# Patient Record
Sex: Female | Born: 1955 | Race: White | Hispanic: No | Marital: Married | Smoking: Never smoker
Health system: Southern US, Community
[De-identification: ages and names within clinical notes are randomized; demographics above are authoritative.]

## PROBLEM LIST (undated history)

## (undated) DIAGNOSIS — R569 Unspecified convulsions: Secondary | ICD-10-CM

## (undated) DIAGNOSIS — I38 Endocarditis, valve unspecified: Secondary | ICD-10-CM

## (undated) DIAGNOSIS — E079 Disorder of thyroid, unspecified: Secondary | ICD-10-CM

## (undated) DIAGNOSIS — M329 Systemic lupus erythematosus, unspecified: Secondary | ICD-10-CM

## (undated) DIAGNOSIS — E039 Hypothyroidism, unspecified: Secondary | ICD-10-CM

## (undated) DIAGNOSIS — I469 Cardiac arrest, cause unspecified: Secondary | ICD-10-CM

## (undated) DIAGNOSIS — I639 Cerebral infarction, unspecified: Secondary | ICD-10-CM

## (undated) HISTORY — PX: PATELLA FRACTURE SURGERY: SHX735

## (undated) HISTORY — PX: APPENDECTOMY: SHX54

## (undated) HISTORY — DX: Cerebral infarction, unspecified: I63.9

## (undated) HISTORY — PX: TUBAL LIGATION: SHX77

---

## 2002-06-11 DIAGNOSIS — I469 Cardiac arrest, cause unspecified: Secondary | ICD-10-CM

## 2002-06-11 HISTORY — DX: Cardiac arrest, cause unspecified: I46.9

## 2013-11-26 ENCOUNTER — Emergency Department (HOSPITAL_COMMUNITY)
Admission: EM | Admit: 2013-11-26 | Discharge: 2013-11-27 | Disposition: A | Discharge status: Home / Self Care | Payer: Medicaid Other | Attending: Emergency Medicine | Admitting: Emergency Medicine

## 2013-11-26 ENCOUNTER — Encounter (HOSPITAL_COMMUNITY): Payer: Self-pay | Admitting: Emergency Medicine

## 2013-11-26 ENCOUNTER — Emergency Department (HOSPITAL_COMMUNITY): Payer: Medicaid Other

## 2013-11-26 DIAGNOSIS — R569 Unspecified convulsions: Secondary | ICD-10-CM

## 2013-11-26 DIAGNOSIS — R072 Precordial pain: Secondary | ICD-10-CM | POA: Diagnosis present

## 2013-11-26 DIAGNOSIS — Z79899 Other long term (current) drug therapy: Secondary | ICD-10-CM | POA: Insufficient documentation

## 2013-11-26 DIAGNOSIS — E039 Hypothyroidism, unspecified: Secondary | ICD-10-CM | POA: Diagnosis not present

## 2013-11-26 DIAGNOSIS — Z8674 Personal history of sudden cardiac arrest: Secondary | ICD-10-CM | POA: Diagnosis not present

## 2013-11-26 DIAGNOSIS — IMO0002 Reserved for concepts with insufficient information to code with codable children: Secondary | ICD-10-CM | POA: Diagnosis not present

## 2013-11-26 DIAGNOSIS — G40909 Epilepsy, unspecified, not intractable, without status epilepticus: Secondary | ICD-10-CM | POA: Insufficient documentation

## 2013-11-26 HISTORY — DX: Systemic lupus erythematosus, unspecified: M32.9

## 2013-11-26 HISTORY — DX: Cardiac arrest, cause unspecified: I46.9

## 2013-11-26 HISTORY — DX: Unspecified convulsions: R56.9

## 2013-11-26 HISTORY — DX: Endocarditis, valve unspecified: I38

## 2013-11-26 HISTORY — DX: Disorder of thyroid, unspecified: E07.9

## 2013-11-26 LAB — BASIC METABOLIC PANEL
BUN: 13 mg/dL (ref 6–23)
CALCIUM: 9 mg/dL (ref 8.4–10.5)
CO2: 23 mEq/L (ref 19–32)
Chloride: 99 mEq/L (ref 96–112)
Creatinine, Ser: 1.13 mg/dL — ABNORMAL HIGH (ref 0.50–1.10)
GFR calc Af Amer: 61 mL/min — ABNORMAL LOW (ref >90)
GFR, EST NON AFRICAN AMERICAN: 53 mL/min — AB (ref >90)
Glucose, Bld: 108 mg/dL — ABNORMAL HIGH (ref 70–99)
Potassium: 4.1 mEq/L (ref 3.7–5.3)
Sodium: 135 mEq/L — ABNORMAL LOW (ref 137–147)

## 2013-11-26 LAB — CBC
HCT: 30 % — ABNORMAL LOW (ref 36.0–46.0)
Hemoglobin: 10.4 g/dL — ABNORMAL LOW (ref 12.0–15.0)
MCH: 32.4 pg (ref 26.0–34.0)
MCHC: 34.7 g/dL (ref 30.0–36.0)
MCV: 93.5 fL (ref 78.0–100.0)
PLATELETS: 121 10*3/uL — AB (ref 150–400)
RBC: 3.21 MIL/uL — AB (ref 3.87–5.11)
RDW: 11.8 % (ref 11.5–15.5)
WBC: 4.5 10*3/uL (ref 4.0–10.5)

## 2013-11-26 LAB — I-STAT TROPONIN, ED: TROPONIN I, POC: 0 ng/mL (ref 0.00–0.08)

## 2013-11-26 LAB — TROPONIN I: Troponin I: <0.3 ng/mL (ref <0.30)

## 2013-11-26 LAB — PHENYTOIN LEVEL, TOTAL: Phenytoin Lvl: <2.5 ug/mL — ABNORMAL LOW (ref 10.0–20.0)

## 2013-11-26 LAB — VALPROIC ACID LEVEL: Valproic Acid Lvl: <10 ug/mL — ABNORMAL LOW (ref 50.0–100.0)

## 2013-11-26 LAB — CBG MONITORING, ED: Glucose-Capillary: 107 mg/dL — ABNORMAL HIGH (ref 70–99)

## 2013-11-26 MED ORDER — SODIUM CHLORIDE 0.9 % IV SOLN
1000.0000 mg | Freq: Once | INTRAVENOUS | Status: AC
  Administered 2013-11-27: 1000 mg via INTRAVENOUS
  Filled 2013-11-26: qty 20

## 2013-11-26 NOTE — ED Notes (Signed)
Pt brought via [facility] for chest pain x 1 week and lethargy. Per family, they recently moved to area and she has been unable to locate depakote and thyroid meds.  C/o mid-sternal chest pain x 1 week.  Pt appears lethargic, but ao x 4.

## 2013-11-26 NOTE — ED Notes (Signed)
Called lab to add on Dilantin level

## 2013-11-26 NOTE — ED Provider Notes (Signed)
CSN: [id number]     Arrival date & time 11/26/13  1830 History   First MD Initiated Contact with Patient 11/26/13 2238     Chief Complaint  Patient presents with  . Chest Pain     (Consider location/radiation/quality/duration/timing/severity/associated sxs/prior Treatment) HPI  She initially came here, for chest pain, but that resolved spontaneously while here waiting to be seen. She also complains of petit mal seizures, and was out of her Dilantin, for 2 weeks. She restarted her Dilantin, 2 days ago. She denies fever, chills, nausea, vomiting, cough, abdominal pain, back, pain, weakness, or dizziness. [place]. There are no other known modifying factors.   Past Medical History  Diagnosis Date  . Endocarditis   . Lupus   . Cardiac arrest 2004  . Seizures   . Thyroid disease     hypothyroidism   Past Surgical History  Procedure Laterality Date  . Appendectomy    . Patella fracture surgery    . Cesarean section    . Tubal ligation     No family history on file. History  Substance Use Topics  . Smoking status: Never Smoker   . Smokeless tobacco: Never Used  . Alcohol Use: No   OB History   Grav Para Term Preterm Abortions TAB SAB Ect Mult Living                 Review of Systems  All other systems reviewed and are negative.     Allergies  Review of patient's allergies indicates no known allergies.  Home Medications   Prior to Admission medications   Medication Sig Start Date End Date Taking? Authorizing Provider  aspirin 81 MG tablet Take 324 mg by mouth once.   Yes Historical Provider, MD  Calcium Carb-Cholecalciferol (CALCIUM 600 + D PO) Take 1 tablet by mouth daily.   Yes Historical Provider, MD  levothyroxine (SYNTHROID, LEVOTHROID) 100 MCG tablet Take 50 mcg by mouth daily before breakfast.   Yes Historical Provider, MD  Multiple Vitamins-Minerals (MULTIVITAMIN WITH MINERALS) tablet Take 1 tablet by  mouth daily.   Yes Historical Provider, MD  phenytoin (DILANTIN) 100 MG ER capsule Take 100-200 mg by mouth 3 (three) times daily. Take 100 mg 3 times a day every wed,thur,friday & sat then 200 mg 3 times a day every mon, tues,sunday   Yes Historical Provider, MD   BP 127/90  Pulse 62  Temp(Src) 98.5 F (36.9 C) (Oral)  Resp 15  Ht 5\' 3"  (1.6 m)  Wt 164 lb (74.39 kg)  BMI 29.06 kg/m2  SpO2 96% Physical Exam  Nursing note and vitals reviewed. Constitutional: She is oriented to person, place, and time. She appears well-developed and well-nourished.  HENT:  Head: Normocephalic and atraumatic.  Eyes: Conjunctivae and EOM are normal. Pupils are equal, round, and reactive to light.  Neck: Normal range of motion and phonation normal. Neck supple.  Cardiovascular: Normal rate, regular rhythm and intact distal pulses.   Pulmonary/Chest: Effort normal and breath sounds normal. No respiratory distress. She has no wheezes. She exhibits no tenderness.  Abdominal: Soft. She exhibits no distension. There is no tenderness. There is no guarding.  Musculoskeletal: Normal range of motion.  Neurological: She is alert and oriented to person, place, and time. She exhibits normal muscle tone.  No dysarthria, aphasia or nystagmus.  Skin: Skin is warm and dry.  Bruising anterior shins bilaterally, mild  Psychiatric: She has a normal mood and affect.  Her behavior is normal. Judgment and thought content normal.    ED Course  Procedures (including critical care time)  22:55- Dilantin level ordered  00:00- Dilantin level subtherapeutic; load ordered.  Labs Review Labs Reviewed  CBC - Abnormal; Notable for the following:    RBC 3.21 (*)    Hemoglobin 10.4 (*)    HCT 30.0 (*)    Platelets 121 (*)    All other components within normal limits  BASIC METABOLIC PANEL - Abnormal; Notable for the following:    Sodium 135 (*)    Glucose, Bld 108 (*)    Creatinine, Ser 1.13 (*)    GFR calc non Af Amer 53  (*)    GFR calc Af Amer 61 (*)    All other components within normal limits  VALPROIC ACID LEVEL - Abnormal; Notable for the following:    Valproic Acid Lvl <10.0 (*)    All other components within normal limits  PHENYTOIN LEVEL, TOTAL - Abnormal; Notable for the following:    Phenytoin Lvl <2.5 (*)    All other components within normal limits  CBG MONITORING, ED - Abnormal; Notable for the following:    Glucose-Capillary 107 (*)    All other components within normal limits  TROPONIN I  Rosezena SensorI-STAT TROPOININ, ED    Imaging Review Dg Chest Port 1 View  11/26/2013   CLINICAL DATA:  Chest pain.  EXAM: PORTABLE CHEST - 1 VIEW  COMPARISON:  None.  FINDINGS: The heart size and mediastinal contours are within normal limits. Both lungs are clear. The visualized skeletal structures are unremarkable.  IMPRESSION: No active disease.   Electronically Signed   By: Rosalie GumsBeth  Brown M.D.   On: 11/26/2013 19:44     EKG Interpretation   Date/Time:  Thursday November 26 2013 18:37:23 EDT Ventricular Rate:  77 PR Interval:  166 QRS Duration: 88 QT Interval:  432 QTC Calculation: 489 R Axis:   -2 Text Interpretation:  Sinus rhythm RSR' in V1 or V2, right VCD or RVH  Borderline T abnormalities, anterior leads Borderline prolonged QT  interval No old tracing to compare Confirmed by [email or web address]  MD, Emelia Sandoval [phone]([id number])  on 11/26/2013 6:49:58 PM      MDM   Final diagnoses:  Epilepsy without status epilepticus, not intractable  Seizure secondary to subtherapeutic anticonvulsant medication    Seizures related to medical noncompliance. No other adverse effects.   Nursing Notes Reviewed/ Care Coordinated Applicable Imaging Reviewed Interpretation of Laboratory Data incorporated into ED treatment  The patient appears reasonably screened and/or stabilized for discharge and I doubt any other medical condition or other [facility] requiring further screening, evaluation, or treatment in the ED at this time prior to  discharge.  Plan: Home Medications- usual; Home Treatments- rest; return here if the recommended treatment, does not improve the symptoms; Recommended follow up- PCP of choice 1 week for check up  Flint MelterElliott L Kasheena Sambrano, MD 11/27/13 [phone]

## 2013-11-27 DIAGNOSIS — G40909 Epilepsy, unspecified, not intractable, without status epilepticus: Secondary | ICD-10-CM | POA: Diagnosis not present

## 2013-11-27 NOTE — Discharge Instructions (Signed)
Take your Dilantin, as prescribed. Use the resource guide, to find a doctor to see you for ongoing care. See a doctor to get her Dilantin level checked in one or 2 weeks.   Epilepsy Epilepsy is a disorder in which a person has repeated seizures over time. A seizure is a release of abnormal electrical activity in the brain. Seizures can cause a change in attention, behavior, or the ability to remain awake and alert (altered mental status). Seizures often involve uncontrollable shaking (convulsions).  Most people with epilepsy lead normal lives. However, people with epilepsy are at an increased risk of falls, accidents, and injuries. Therefore, it is important to begin treatment right away. CAUSES  Epilepsy has many possible causes. Anything that disturbs the normal pattern of brain cell activity can lead to seizures. This may include:   Head injury.  Birth trauma.  High fever as a child.  Stroke.  Bleeding into or around the brain.  Certain drugs.  Prolonged low oxygen, such as what occurs after CPR efforts.  Abnormal brain development.  Certain illnesses, such as meningitis, encephalitis (brain infection), malaria, and other infections.  An imbalance of nerve signaling chemicals (neurotransmitters).  SIGNS AND SYMPTOMS  The symptoms of a seizure can vary greatly from one person to another. Right before a seizure, you may have a warning (aura) that a seizure is about to occur. An aura may include the following symptoms:  Fear or anxiety.  Nausea.  Feeling like the room is spinning (vertigo).  Vision changes, such as seeing flashing lights or spots. Common symptoms during a seizure include:  Abnormal sensations, such as an abnormal smell or a bitter taste in the mouth.   Sudden, general body stiffness.   Convulsions that involve rhythmic jerking of the face, arm, or leg on one or both sides.   Sudden change in consciousness.   Appearing to be awake but not  responding.   Appearing to be asleep but cannot be awakened.   Grimacing, chewing, lip smacking, drooling, tongue biting, or loss of bowel or bladder control. After a seizure, you may feel sleepy for a while. DIAGNOSIS  Your health care provider will ask about your symptoms and take a medical history. Descriptions from any witnesses to your seizures will be very helpful in the diagnosis. A physical exam, including a detailed neurological exam, is necessary. Various tests may be done, such as:   An electroencephalogram (EEG). This is a painless test of your brain waves. In this test, a diagram is created of your brain waves. These diagrams can be interpreted by a specialist.  An MRI of the brain.   A CT scan of the brain.   A spinal tap (lumbar puncture, LP).  Blood tests to check for signs of infection or abnormal blood chemistry. TREATMENT  There is no cure for epilepsy, but it is generally treatable. Once epilepsy is diagnosed, it is important to begin treatment as soon as possible. For most people with epilepsy, seizures can be controlled with medicines. The following may also be used:  A pacemaker for the brain (vagus nerve stimulator) can be used for people with seizures that are not well controlled by medicine.  Surgery on the brain. For some people, epilepsy eventually goes away. HOME CARE INSTRUCTIONS   Follow your health care provider's recommendations on driving and safety in normal activities.  Get enough rest. Lack of sleep can cause seizures.  Only take over-the-counter or prescription medicines as directed by your health care  provider. Take any prescribed medicine exactly as directed.  Avoid any known triggers of your seizures.  Keep a seizure diary. Record what you recall about any seizure, especially any possible trigger.   Make sure the people you live and work with know that you are prone to seizures. They should receive instructions on how to help you. In  general, a witness to a seizure should:   Cushion your head and body.   Turn you on your side.   Avoid unnecessarily restraining you.   Not place anything inside your mouth.   Call for emergency medical help if there is any question about what has occurred.   Follow up with your health care provider as directed. You may need regular blood tests to monitor the levels of your medicine.  SEEK MEDICAL CARE IF:   You develop signs of infection or other illness. This might increase the risk of a seizure.   You seem to be having more frequent seizures.   Your seizure pattern is changing.  SEEK IMMEDIATE MEDICAL CARE IF:   You have a seizure that does not stop after a few moments.   You have a seizure that causes any difficulty in breathing.   You have a seizure that results in a very severe headache.   You have a seizure that leaves you with the inability to speak or use a part of your body.  Document Released: 05/28/2005 Document Revised: 03/18/2013 Document Reviewed: 01/07/2013 [facility] Patient Information 2015 [place], [place]. This information is not intended to replace advice given to you by your health care provider. Make sure you discuss any questions you have with your health care provider.    Emergency Department Resource Guide 1) Find a Doctor and Pay Out of Pocket Although you won't have to find out who is covered by your insurance plan, it is a good idea to ask around and get recommendations. You will then need to call the office and see if the doctor you have chosen will accept you as a new patient and what types of options they offer for patients who are self-pay. Some doctors offer discounts or will set up payment plans for their patients who do not have insurance, but you will need to ask so you aren't surprised when you get to your appointment.  2) Contact Your Local Health Department Not all health departments have doctors that can see patients for sick  visits, but many do, so it is worth a call to see if yours does. If you don't know where your local health department is, you can check in your phone book. The CDC also has a tool to help you locate your state's health department, and many state websites also have listings of all of their local health departments.  3) Find a Walk-in Clinic If your illness is not likely to be very severe or complicated, you may want to try a walk in clinic. These are popping up all over the country in pharmacies, drugstores, and shopping centers. They're usually staffed by nurse practitioners or physician assistants that have been trained to treat common illnesses and complaints. They're usually fairly quick and inexpensive. However, if you have serious medical issues or chronic medical problems, these are probably not your best option.  No Primary Care Doctor: - Call Health Connect at  [phone] - they can help you locate a primary care doctor that  accepts your insurance, provides certain services, etc. - Physician Referral Service- [phone]  Chronic Pain  Problems: Organization         Address  Phone   Notes  [facility]  [phone] Patients need to be referred by their primary care doctor.   Medication Assistance: Organization         Address  Phone   Notes  [facility] Medication [facility] [address] [place], [place] [address] [phone] --Must be a resident of [facility] -- Must have NO insurance coverage whatsoever (no Medicaid/ Medicare, etc.) -- The pt. MUST have a primary care doctor that directs their care regularly and follows them in the community   [facility]  [phone]   [employer]  [phone]    Agencies that provide inexpensive medical care: Organization         Address  Phone   Notes  [facility]  [phone]   [facility]    [phone]   [facility] [address] [place], [place] [address] [phone]   [facility] [address] [place] [place] [phone]   Planned Parenthood    [phone]   [facility]    [phone]   [facility]  [address], [place] Phone:  [phone], Fax:  [phone] Hours of Operation:  9 am - 6 pm, M-F.  Also accepts Medicaid/Medicare and self-pay.  [facility]  [address], [place] Phone: [phone], Fax: [phone]. Hours of Operation:  8:30 am - 5:30 pm, M-F.  Also accepts Medicaid and self-pay.  [facility] [place] [address] [place] Phone: [phone]   [facility] [address] Natasha Bence [place], [place] [phone], Ext. [phone] Mondays & Thursdays: 7-9 AM.  First 15 patients are seen on a first come, first serve basis.    Medicaid-accepting [facility] Providers:  Organization         Address  Phone   Notes  [facility] [address], [place] [phone] Also accepts self-pay patients.  [facility] [address] Laurell Josephs [place], [place]  [phone]   [facility] [address], [place] [phone]   [facility] [address], [place] [phone]   Renaye Rakers [address], [place]   [phone] Only accepts [place] Access [place] patients after they have their name applied to their card.   Self-Pay (no insurance) in [facility]:  Organization         Address  Phone   Notes  Sickle Cell Patients, [facility] [address] [place], [place] [phone]   [facility] [address] [place], [place] [phone]   [facility] [place]  [address], [place] [phone]   [facility]/Dr. Osei-Bonsu  [address]  [place] or [address], [place] [phone] Phone number for both [place] and [place] locations is the same.  [facility] [address], [place] [phone]   [facility] [address], [place] or [address] [phone]   [facility] [address], [place] [phone], phone; [phone], fax Sees patients 1st  and 3rd Saturday of every month.  Must not qualify for public or private insurance (i.e. Medicaid, Medicare, [place] Health Choice, Veterans' Benefits)  Household income should be no more than 200% of the poverty level The clinic cannot treat you if you are pregnant or think you are pregnant  Sexually transmitted diseases are not treated at the clinic.    Dental Care: Organization         Address  Phone  Notes  [facility] [address], [place] [phone] Accepts children up to age 58 who are enrolled in [place] or [place] Health Choice; pregnant women with a Medicaid card; and children who have applied for Medicaid or [place] Health Choice, but were declined, whose parents can pay a reduced fee at time of service.  [facility] [place]  [address], [place] [phone] Accepts children up to age 58 who are enrolled in [place] or [place] Health Choice; pregnant women with a Medicaid card; and children who have applied for Medicaid or  Health Choice, but were declined, whose parents can pay a reduced fee at time of service.  [facility]  [address], [place] [phone] Patients are seen by appointment only. Walk-ins are not accepted. [facility] will see patients 58 years of age and older. Monday - Tuesday (8am-5pm) Most Wednesdays (8:30-5pm) $30 per visit, cash only  [facility]  [address], [facility] [phone] Patients are seen by appointment only. Walk-ins are not accepted. [facility] will see patients 58 years of age and older. One Wednesday Evening (Monthly: Volunteer Based).  $30 per visit, cash only  [facility]  [phone] for adults; Children under age 684, call Graduate Pediatric Dentistry at [phone]. Children aged 58-14, please call [phone] to request a pediatric application.  Dental services are provided in all areas of dental care including fillings, crowns and bridges, complete and partial dentures, implants, gum treatment, root canals, and extractions. Preventive care is also provided. Treatment is provided to both adults and children. Patients are selected via a lottery and there is often a waiting list.   [facility] [address], [place]  [phone] [email or web address]   [facility] [address], [place], [place] [phone], Ext. [phone] Second and Fourth Thursday of each month, opens at 6:30 AM; Clinic ends at 9 AM.  Patients are seen on a first-come first-served basis, and a limited number are seen during each clinic.   [facility]  [address], [place], [place] [phone]   Eligibility Requirements You must have lived in [place], [place], or [place] counties for at least the last three months.   You cannot be eligible for state or federal sponsored [place] Cityhealthcare insurance, including CIGNAVeterans Administration, [place]icaid, or [employer] EntertainmentMedicare.   You generally cannot be eligible for healthcare insurance through your employer.    How to apply: Eligibility screenings are held every Tuesday and Wednesday afternoon from 1:00 pm until 4:00 pm. You do not need an appointment for the interview!  [facility] [address], [place], [place] [phone]   [facility]  [phone]   [facility]  [phone]   [facility]  [phone]    Behavioral Health Resources in the Community: Intensive Outpatient Programs Organization  Address  Phone  Notes  [facility] [address] [place], [place] [phone]   [facility] Outpatient [address], [place], [place] [phone]   [facility] [address], [place], [place]  [phone]   [facility] Mental Health [address],  [place], [place] [phone] or [phone]   Substance Abuse Resources Organization         Address  Phone  Notes  Alcohol [facility] Drug Services  [phone]   [facility]  [phone]   [facility] [place]  [phone]   Floydene Flock  [phone]   Residential & Outpatient Substance Abuse Program  [phone]   Psychological Services Organization         Address  Phone  Notes  [facility] Health  [phone]   [facility]  [phone]   [facility] [address] [place] [phone] or [phone]    Mobile Crisis Teams Organization         Address  Phone  Notes  [facility], Mobile Crisis Care Unit  [phone]   [facility]  [address] [place], [place] [phone]   Doristine Locks [address] [place] [phone]    Self-Help/Support Groups Organization         Address  Phone             Notes  [facility] [place] - variety of support groups  336- I7437963 Call for more information  Narcotics Anonymous (NA), [facility] [address], [employer] [place]  2 meetings at this location   Statistician         Address  Phone  Notes  [facility] [address],    [place]  [phone]   [facility]  [address], [place] [address], [place], [place] [phone]   [facility] [address] [place], [place]  [phone] Admissions: 8am-3pm M-F  [facility] [address],    [place], [place] [phone]   [facility] [address] [place], [place], [place] [phone]   [facility] [address],  [place], [place] [phone]   [facility] - Intensive Outpatient [address]ure[address] Josephs [address], [place], [place] [phone]   [facility] ([facility] [address] [place].,  [place], [place] [phone] or [phone]   [facility] ([facility]) [address] [place], [place] [phone] Accepts Medicaid  [facility] [address],  [place] [phone] Substance Abuse/Addiction Treatment   [facility] Organization         Address  Phone  Notes  [facility]  [phone]   Angie Fava, PhD [address] Ervin Knack [place], [place]   [phone] or [phone]   [facility] Behavioral   [address] [place], [place] [phone]   [facility] [address] [place], [place] [phone] Insurance/Medicaid/sponsorship through [facility] and Families [address]                                    [place], [place] [phone] Therapy/tele-psych/case  [facility] [address], [place] [phone]    Dr. Lolly Mustache  [phone]   [facility] [place]  [facility] Dept. 1) [address], [place] 2) [address], [place] 3)  [address], [place] [phone]  [phone]   [facility] Child Abuse Hotline (331)007-6288 or [phone] (After Hours)

## 2016-11-24 ENCOUNTER — Encounter (HOSPITAL_COMMUNITY): Payer: Self-pay | Admitting: Emergency Medicine

## 2016-11-24 ENCOUNTER — Emergency Department (HOSPITAL_COMMUNITY): Payer: Medicaid Other

## 2016-11-24 DIAGNOSIS — Z8674 Personal history of sudden cardiac arrest: Secondary | ICD-10-CM

## 2016-11-24 DIAGNOSIS — R569 Unspecified convulsions: Secondary | ICD-10-CM | POA: Diagnosis present

## 2016-11-24 DIAGNOSIS — Z7982 Long term (current) use of aspirin: Secondary | ICD-10-CM

## 2016-11-24 DIAGNOSIS — L930 Discoid lupus erythematosus: Secondary | ICD-10-CM | POA: Diagnosis present

## 2016-11-24 DIAGNOSIS — K5651 Intestinal adhesions [bands], with partial obstruction: Principal | ICD-10-CM | POA: Diagnosis present

## 2016-11-24 DIAGNOSIS — Z79899 Other long term (current) drug therapy: Secondary | ICD-10-CM

## 2016-11-24 DIAGNOSIS — E039 Hypothyroidism, unspecified: Secondary | ICD-10-CM | POA: Diagnosis present

## 2016-11-24 DIAGNOSIS — N183 Chronic kidney disease, stage 3 (moderate): Secondary | ICD-10-CM | POA: Diagnosis present

## 2016-11-24 DIAGNOSIS — K5790 Diverticulosis of intestine, part unspecified, without perforation or abscess without bleeding: Secondary | ICD-10-CM | POA: Diagnosis present

## 2016-11-24 DIAGNOSIS — D72829 Elevated white blood cell count, unspecified: Secondary | ICD-10-CM | POA: Diagnosis present

## 2016-11-24 LAB — CBC
HCT: 35.1 % — ABNORMAL LOW (ref 36.0–46.0)
Hemoglobin: 11.7 g/dL — ABNORMAL LOW (ref 12.0–15.0)
MCH: 32.4 pg (ref 26.0–34.0)
MCHC: 33.3 g/dL (ref 30.0–36.0)
MCV: 97.2 fL (ref 78.0–100.0)
PLATELETS: 182 10*3/uL (ref 150–400)
RBC: 3.61 MIL/uL — AB (ref 3.87–5.11)
RDW: 12.2 % (ref 11.5–15.5)
WBC: 11.8 10*3/uL — AB (ref 4.0–10.5)

## 2016-11-24 LAB — I-STAT TROPONIN, ED: TROPONIN I, POC: 0.01 ng/mL (ref 0.00–0.08)

## 2016-11-24 LAB — BASIC METABOLIC PANEL
Anion gap: 12 (ref 5–15)
BUN: 11 mg/dL (ref 6–20)
CALCIUM: 9.6 mg/dL (ref 8.9–10.3)
CO2: 25 mmol/L (ref 22–32)
CREATININE: 1.23 mg/dL — AB (ref 0.44–1.00)
Chloride: 97 mmol/L — ABNORMAL LOW (ref 101–111)
GFR calc non Af Amer: 47 mL/min — ABNORMAL LOW (ref >60)
GFR, EST AFRICAN AMERICAN: 54 mL/min — AB (ref >60)
Glucose, Bld: 160 mg/dL — ABNORMAL HIGH (ref 65–99)
Potassium: 3.7 mmol/L (ref 3.5–5.1)
Sodium: 134 mmol/L — ABNORMAL LOW (ref 135–145)

## 2016-11-24 NOTE — ED Triage Notes (Signed)
Pt presents to ED for assessment of epigastric pain starting approx 2 hours ago, followed by 5 episodes of vomiting, constant nausea, and general malaise.  Pt denies SOB.

## 2016-11-25 ENCOUNTER — Inpatient Hospital Stay (HOSPITAL_COMMUNITY)
Admission: EM | Admit: 2016-11-25 | Discharge: 2016-11-28 | DRG: 390 | Disposition: A | Discharge status: Home / Self Care | Payer: Medicaid Other | Attending: Internal Medicine | Admitting: Internal Medicine

## 2016-11-25 ENCOUNTER — Inpatient Hospital Stay (HOSPITAL_COMMUNITY): Payer: Medicaid Other

## 2016-11-25 ENCOUNTER — Encounter (HOSPITAL_COMMUNITY): Payer: Self-pay

## 2016-11-25 ENCOUNTER — Emergency Department (HOSPITAL_COMMUNITY): Payer: Medicaid Other

## 2016-11-25 DIAGNOSIS — N183 Chronic kidney disease, stage 3 unspecified: Secondary | ICD-10-CM | POA: Diagnosis present

## 2016-11-25 DIAGNOSIS — Z0189 Encounter for other specified special examinations: Secondary | ICD-10-CM | POA: Diagnosis not present

## 2016-11-25 DIAGNOSIS — Z79899 Other long term (current) drug therapy: Secondary | ICD-10-CM | POA: Diagnosis not present

## 2016-11-25 DIAGNOSIS — E032 Hypothyroidism due to medicaments and other exogenous substances: Secondary | ICD-10-CM | POA: Diagnosis not present

## 2016-11-25 DIAGNOSIS — K565 Intestinal adhesions [bands], unspecified as to partial versus complete obstruction: Secondary | ICD-10-CM | POA: Diagnosis present

## 2016-11-25 DIAGNOSIS — D72829 Elevated white blood cell count, unspecified: Secondary | ICD-10-CM | POA: Diagnosis present

## 2016-11-25 DIAGNOSIS — R569 Unspecified convulsions: Secondary | ICD-10-CM | POA: Diagnosis not present

## 2016-11-25 DIAGNOSIS — L930 Discoid lupus erythematosus: Secondary | ICD-10-CM | POA: Diagnosis present

## 2016-11-25 DIAGNOSIS — Z8674 Personal history of sudden cardiac arrest: Secondary | ICD-10-CM | POA: Diagnosis not present

## 2016-11-25 DIAGNOSIS — E039 Hypothyroidism, unspecified: Secondary | ICD-10-CM | POA: Diagnosis present

## 2016-11-25 DIAGNOSIS — K5651 Intestinal adhesions [bands], with partial obstruction: Secondary | ICD-10-CM | POA: Diagnosis present

## 2016-11-25 DIAGNOSIS — K56609 Unspecified intestinal obstruction, unspecified as to partial versus complete obstruction: Secondary | ICD-10-CM | POA: Diagnosis present

## 2016-11-25 DIAGNOSIS — K5790 Diverticulosis of intestine, part unspecified, without perforation or abscess without bleeding: Secondary | ICD-10-CM | POA: Diagnosis present

## 2016-11-25 DIAGNOSIS — Z7982 Long term (current) use of aspirin: Secondary | ICD-10-CM | POA: Diagnosis not present

## 2016-11-25 HISTORY — DX: Hypothyroidism, unspecified: E03.9

## 2016-11-25 LAB — CBC
HCT: 38.1 % (ref 36.0–46.0)
HEMOGLOBIN: 12.9 g/dL (ref 12.0–15.0)
MCH: 33.5 pg (ref 26.0–34.0)
MCHC: 33.9 g/dL (ref 30.0–36.0)
MCV: 99 fL (ref 78.0–100.0)
Platelets: 155 10*3/uL (ref 150–400)
RBC: 3.85 MIL/uL — AB (ref 3.87–5.11)
RDW: 12.5 % (ref 11.5–15.5)
WBC: 9.9 10*3/uL (ref 4.0–10.5)

## 2016-11-25 LAB — TROPONIN I

## 2016-11-25 LAB — BASIC METABOLIC PANEL
ANION GAP: 10 (ref 5–15)
BUN: 13 mg/dL (ref 6–20)
CALCIUM: 9.5 mg/dL (ref 8.9–10.3)
CO2: 29 mmol/L (ref 22–32)
Chloride: 99 mmol/L — ABNORMAL LOW (ref 101–111)
Creatinine, Ser: 1.23 mg/dL — ABNORMAL HIGH (ref 0.44–1.00)
GFR, EST AFRICAN AMERICAN: 54 mL/min — AB (ref >60)
GFR, EST NON AFRICAN AMERICAN: 47 mL/min — AB (ref >60)
GLUCOSE: 128 mg/dL — AB (ref 65–99)
Potassium: 4.5 mmol/L (ref 3.5–5.1)
SODIUM: 138 mmol/L (ref 135–145)

## 2016-11-25 LAB — TYPE AND SCREEN
ABO/RH(D): A POS
Antibody Screen: NEGATIVE

## 2016-11-25 LAB — HEPATIC FUNCTION PANEL
ALT: 17 U/L (ref 14–54)
AST: 25 U/L (ref 15–41)
Albumin: 4.3 g/dL (ref 3.5–5.0)
Alkaline Phosphatase: 106 U/L (ref 38–126)
BILIRUBIN DIRECT: 0.2 mg/dL (ref 0.1–0.5)
BILIRUBIN INDIRECT: 0.4 mg/dL (ref 0.3–0.9)
TOTAL PROTEIN: 8.3 g/dL — AB (ref 6.5–8.1)
Total Bilirubin: 0.6 mg/dL (ref 0.3–1.2)

## 2016-11-25 LAB — HIV ANTIBODY (ROUTINE TESTING W REFLEX): HIV SCREEN 4TH GENERATION: NONREACTIVE

## 2016-11-25 LAB — ABO/RH: ABO/RH(D): A POS

## 2016-11-25 LAB — APTT: aPTT: 47 seconds — ABNORMAL HIGH (ref 24–36)

## 2016-11-25 LAB — PROTIME-INR
INR: 1.09
Prothrombin Time: 14.2 seconds (ref 11.4–15.2)

## 2016-11-25 LAB — PHENYTOIN LEVEL, TOTAL: Phenytoin Lvl: 5.6 ug/mL — ABNORMAL LOW (ref 10.0–20.0)

## 2016-11-25 LAB — LACTIC ACID, PLASMA: LACTIC ACID, VENOUS: 2.6 mmol/L — AB (ref 0.5–1.9)

## 2016-11-25 LAB — LIPASE, BLOOD: LIPASE: 35 U/L (ref 11–51)

## 2016-11-25 MED ORDER — SODIUM CHLORIDE 0.9 % IV SOLN
200.0000 mg | INTRAVENOUS | Status: DC
  Administered 2016-11-25 – 2016-11-27 (×3): 200 mg via INTRAVENOUS
  Filled 2016-11-25 (×5): qty 4

## 2016-11-25 MED ORDER — ENOXAPARIN SODIUM 40 MG/0.4ML ~~LOC~~ SOLN
40.0000 mg | SUBCUTANEOUS | Status: DC
  Administered 2016-11-25 – 2016-11-28 (×4): 40 mg via SUBCUTANEOUS
  Filled 2016-11-25 (×4): qty 0.4

## 2016-11-25 MED ORDER — ONDANSETRON HCL 4 MG PO TABS: 4.0000 mg | ORAL_TABLET | Freq: Four times a day (QID) | ORAL | Status: DC | PRN

## 2016-11-25 MED ORDER — PHENYTOIN SODIUM 50 MG/ML IJ SOLN
100.0000 mg | INTRAMUSCULAR | Status: DC
  Administered 2016-11-25 – 2016-11-28 (×6): 100 mg via INTRAVENOUS
  Filled 2016-11-25 (×6): qty 2

## 2016-11-25 MED ORDER — SODIUM CHLORIDE 0.9 % IV BOLUS (SEPSIS)
1000.0000 mL | Freq: Once | INTRAVENOUS | Status: AC
  Administered 2016-11-25: 1000 mL via INTRAVENOUS

## 2016-11-25 MED ORDER — ACETAMINOPHEN 650 MG RE SUPP: 650.0000 mg | Freq: Four times a day (QID) | RECTAL | Status: DC | PRN

## 2016-11-25 MED ORDER — LEVOTHYROXINE SODIUM 100 MCG IV SOLR
25.0000 ug | Freq: Every day | INTRAVENOUS | Status: DC
  Administered 2016-11-25 – 2016-11-27 (×3): 25 ug via INTRAVENOUS
  Filled 2016-11-25 (×3): qty 5

## 2016-11-25 MED ORDER — DIATRIZOATE MEGLUMINE & SODIUM 66-10 % PO SOLN
90.0000 mL | Freq: Once | ORAL | Status: AC
  Administered 2016-11-25: 90 mL via NASOGASTRIC
  Filled 2016-11-25: qty 90

## 2016-11-25 MED ORDER — SODIUM CHLORIDE 0.9 % IV BOLUS (SEPSIS)
1500.0000 mL | Freq: Once | INTRAVENOUS | Status: AC
  Administered 2016-11-25: 1500 mL via INTRAVENOUS

## 2016-11-25 MED ORDER — IOPAMIDOL (ISOVUE-300) INJECTION 61%
INTRAVENOUS | Status: AC
  Administered 2016-11-25: 80 mL
  Filled 2016-11-25: qty 100

## 2016-11-25 MED ORDER — MORPHINE SULFATE (PF) 4 MG/ML IV SOLN
2.0000 mg | INTRAVENOUS | Status: DC | PRN
Start: 2016-11-25 — End: 2016-11-28
  Administered 2016-11-25: 2 mg via INTRAVENOUS
  Filled 2016-11-25: qty 1

## 2016-11-25 MED ORDER — FENTANYL CITRATE (PF) 100 MCG/2ML IJ SOLN
25.0000 ug | Freq: Once | INTRAMUSCULAR | Status: AC
Start: 2016-11-25 — End: 2016-11-25
  Administered 2016-11-25: 25 ug via INTRAVENOUS
  Filled 2016-11-25: qty 2

## 2016-11-25 MED ORDER — PHENYTOIN SODIUM 50 MG/ML IJ SOLN: 100.0000 mg | INTRAMUSCULAR | Status: DC

## 2016-11-25 MED ORDER — SODIUM CHLORIDE 0.9% FLUSH
3.0000 mL | Freq: Two times a day (BID) | INTRAVENOUS | Status: DC
  Administered 2016-11-25: 3 mL via INTRAVENOUS

## 2016-11-25 MED ORDER — BISACODYL 10 MG RE SUPP
10.0000 mg | Freq: Once | RECTAL | Status: AC
Start: 2016-11-25 — End: 2016-11-25
  Administered 2016-11-25: 10 mg via RECTAL

## 2016-11-25 MED ORDER — PHENYTOIN SODIUM 50 MG/ML IJ SOLN: 100.0000 mg | Freq: Three times a day (TID) | INTRAMUSCULAR | Status: DC

## 2016-11-25 MED ORDER — SODIUM CHLORIDE 0.9 % IV SOLN
INTRAVENOUS | Status: DC
  Administered 2016-11-25 – 2016-11-27 (×3): via INTRAVENOUS
  Administered 2016-11-27: 100 mL/h via INTRAVENOUS
  Administered 2016-11-27 – 2016-11-28 (×2): via INTRAVENOUS

## 2016-11-25 MED ORDER — ONDANSETRON HCL 4 MG/2ML IJ SOLN
4.0000 mg | Freq: Once | INTRAMUSCULAR | Status: AC
  Administered 2016-11-25: 4 mg via INTRAVENOUS
  Filled 2016-11-25: qty 2

## 2016-11-25 MED ORDER — ONDANSETRON HCL 4 MG/2ML IJ SOLN
4.0000 mg | Freq: Four times a day (QID) | INTRAMUSCULAR | Status: DC | PRN
  Administered 2016-11-25 (×2): 4 mg via INTRAVENOUS
  Filled 2016-11-25 (×2): qty 2

## 2016-11-25 MED ORDER — ACETAMINOPHEN 325 MG PO TABS: 650.0000 mg | ORAL_TABLET | Freq: Four times a day (QID) | ORAL | Status: DC | PRN

## 2016-11-25 NOTE — Progress Notes (Signed)
Patient re-examined and she is still vomiting intermittently.  Repeat X-rays show continued obstruction.  She is not having any pain.  Will go ahead and place NGT and start SBO protocol  Marta LamasJames O. Gae BonWyatt, III, MD, FACS [phone]--pager [phone]--office [facility]

## 2016-11-25 NOTE — ED Notes (Signed)
No phone call has been returned by admnitting MD to inform of + lactic acid 2.6

## 2016-11-25 NOTE — H&P (Addendum)
History and Physical    Santia Labate ZOX:[id number] DOB: August 18, 1955 DOA: 11/25/2016  Referring MD/NP/PA:   PCP: Patient, No Pcp Per   Patient coming from:  The patient is coming from home.  At baseline, pt is independent for most of ADL.  Chief Complaint: Abdominal pain, nausea and vomiting  HPI: Karla [name] is a 61 y.o. female with medical history significant of seizure, lupus, endocarditis, cardiac arrest, chronic kidney disease-stage III, hypothyroidism, who presents with abdominal pain, nausea or vomiting.  Patient states that her symptoms started last night. He has a abdominal pain, which is located in central abdomen, constant, moderate, sharp, nonradiating. It is associated with nausea and vomiting. She has had nonbloody vomiting 6-8 times. No diarrhea. Her last bowel movement was 3 days ago. Patient states that she had mild lower chest pain earlier, which has resolved. Currently no chest pain, shortness breath, cough, symptoms of UTI or unilateral weakness. No fever or chills.  ED Course: pt was found to have WBC 11.8, creatinine 1.23, negative troponin, lipase is 35, temperature normal, no tachycardia, O2 sat 93% on room air, negative chest x-ray. CT abdomen/pelvis findings are concerning for developing bowel obstruction, with possible transition point in the right upper quadrant anterior to the liver. Pt is admitted to telemetry bed as inpatient.  Surgeon, Dr. Derrell Lolling was consulted, will see in AM.   Review of Systems:   General: no fevers, chills, no changes in body weight, has poor appetite, has fatigue HEENT: no blurry vision, hearing changes or sore throat Respiratory: no dyspnea, coughing, wheezing CV: had chest pain, no palpitations GI: has nausea, vomiting, abdominal pain, no diarrhea, constipation GU: no dysuria, burning on urination, increased urinary frequency, hematuria  Ext: no leg edema Neuro: no unilateral weakness, numbness, or tingling, no vision  change or hearing loss Skin: no rash, no skin tear. MSK: No muscle spasm, no deformity, no limitation of range of movement in spin Heme: No easy bruising.  Travel history: No recent long distant travel.  Allergy: No Known Allergies  Past Medical History:  Diagnosis Date  . Cardiac arrest (HCC) 2004  . Endocarditis   . Hypothyroidism   . Lupus   . Seizures (HCC)   . Thyroid disease    hypothyroidism    Past Surgical History:  Procedure Laterality Date  . APPENDECTOMY    . CESAREAN SECTION    . PATELLA FRACTURE SURGERY    . TUBAL LIGATION      Social History:  reports that she has never smoked. She has never used smokeless tobacco. She reports that she does not drink alcohol or use drugs.  Family History:  Family History  Problem Relation Age of Onset  . Lung cancer Father   . Thyroid disease Sister      Prior to Admission medications   Medication Sig Start Date End Date Taking? Authorizing Provider  aspirin EC 81 MG tablet Take 81 mg by mouth daily.   Yes [provider]  Calcium Carb-Cholecalciferol (CALCIUM 600 + D PO) Take 1 tablet by mouth daily.   Yes [provider]  levothyroxine (SYNTHROID, LEVOTHROID) 100 MCG tablet Take 50 mcg by mouth daily before breakfast.   Yes [provider]  Multiple Vitamins-Minerals (MULTIVITAMIN WITH MINERALS) tablet Take 1 tablet by mouth daily.   Yes [provider]  phenytoin (DILANTIN) 100 MG ER capsule Take 100-200 mg by mouth See admin instructions. Take 1 capsule at 8 am, 1 pm and then take 2 capsules at  beditme   Yes [provider]    Physical Exam: Vitals:   11/25/16 0230 11/25/16 0245 11/25/16 0248 11/25/16 0300  BP: 132/73 126/73  134/79  Pulse: (!) 59 75  61  Resp: 19 (!) 21  15  Temp:   98.5 F (36.9 C)   TempSrc:   Rectal   SpO2: 96% 96%  98%  Weight:      Height:       General: Not in acute distress HEENT:       Eyes: PERRL, EOMI, no scleral icterus.       ENT:  No discharge from the ears and nose, no pharynx injection, no tonsillar enlargement.        Neck: No JVD, no bruit, no mass felt. Heme: No neck lymph node enlargement. Cardiac: S1/S2, RRR, No murmurs, No gallops or rubs. Respiratory: No rales, wheezing, rhonchi or rubs. GI: mildly distended, diffused tenderness, no rebound pain, no organomegaly, BS present. GU: No hematuria Ext: No pitting leg edema bilaterally. 2+DP/PT pulse bilaterally. Musculoskeletal: No joint deformities, No joint redness or warmth, no limitation of ROM in spin. Skin: No rashes.  Neuro: Alert, oriented X3, cranial nerves II-XII grossly intact, moves all extremities normally. Psych: Patient is not psychotic, no suicidal or hemocidal ideation.  Labs on Admission: I have personally reviewed following labs and imaging studies  CBC:  Recent Labs Lab 11/24/16 2226  WBC 11.8*  HGB 11.7*  HCT 35.1*  MCV 97.2  PLT 182   Basic Metabolic Panel:  Recent Labs Lab 11/24/16 2226  NA 134*  K 3.7  CL 97*  CO2 25  GLUCOSE 160*  BUN 11  CREATININE 1.23*  CALCIUM 9.6   GFR: Estimated Creatinine Clearance: 45 mL/min (A) (by C-G formula based on SCr of 1.23 mg/dL (H)). Liver Function Tests:  Recent Labs Lab 11/25/16 0017  AST 25  ALT 17  ALKPHOS 106  BILITOT 0.6  PROT 8.3*  ALBUMIN 4.3    Recent Labs Lab 11/25/16 0017  LIPASE 35   No results for input(s): AMMONIA in the last 168 hours. Coagulation Profile: No results for input(s): INR, PROTIME in the last 168 hours. Cardiac Enzymes: No results for input(s): CKTOTAL, CKMB, CKMBINDEX, TROPONINI in the last 168 hours. BNP (last 3 results) No results for input(s): PROBNP in the last 8760 hours. HbA1C: No results for input(s): HGBA1C in the last 72 hours. CBG: No results for input(s): GLUCAP in the last 168 hours. Lipid Profile: No results for input(s): CHOL, HDL, LDLCALC, TRIG, CHOLHDL, LDLDIRECT in the last 72 hours. Thyroid Function Tests: No  results for input(s): TSH, T4TOTAL, FREET4, T3FREE, THYROIDAB in the last 72 hours. Anemia Panel: No results for input(s): VITAMINB12, FOLATE, FERRITIN, TIBC, IRON, RETICCTPCT in the last 72 hours. Urine analysis: No results found for: COLORURINE, APPEARANCEUR, LABSPEC, PHURINE, GLUCOSEU, HGBUR, BILIRUBINUR, KETONESUR, PROTEINUR, UROBILINOGEN, NITRITE, LEUKOCYTESUR Sepsis Labs: @LABRCNTIP (procalcitonin:4,lacticidven:4) )No results found for this or any previous visit (from the past 240 hour(s)).   Radiological Exams on Admission: Dg Chest 2 View  Result Date: 11/24/2016 CLINICAL DATA:  Acute onset of generalized chest pain, nausea, vomiting and body aches. Initial encounter. EXAM: CHEST  2 VIEW COMPARISON:  Chest radiograph performed 11/26/2013 FINDINGS: The lungs are well-aerated and clear. There is no evidence of focal opacification, pleural effusion or pneumothorax. The heart is normal in size; the mediastinal contour is within normal limits. No acute osseous abnormalities are seen. Bowel loops are noted interposed superior to the liver. IMPRESSION: No  acute cardiopulmonary process seen. Electronically Signed   By: Roanna Raider M.D.   On: 11/24/2016 23:42   Ct Abdomen Pelvis W Contrast  Result Date: 11/25/2016 CLINICAL DATA:  Epigastric pain with vomiting EXAM: CT ABDOMEN AND PELVIS WITH CONTRAST TECHNIQUE: Multidetector CT imaging of the abdomen and pelvis was performed using the standard protocol following bolus administration of intravenous contrast. CONTRAST:  80mL ISOVUE-300 IOPAMIDOL (ISOVUE-300) INJECTION 61% COMPARISON:  None. FINDINGS: Lower chest: Lung bases demonstrate no acute consolidation or effusion. Patchy dependent atelectasis. Normal heart size. Hepatobiliary: No focal hepatic abnormality. No calcified gallstones or biliary dilatation. Pancreas: Unremarkable. No pancreatic ductal dilatation or surrounding inflammatory changes. Spleen: Normal in size without focal abnormality.  Adrenals/Urinary Tract: Adrenal glands are unremarkable. Kidneys are normal, without renal calculi, focal lesion, or hydronephrosis. Bladder is unremarkable. Stomach/Bowel: The stomach is moderately enlarged. There are multiple fluid-filled loops of borderline to slightly enlarged small bowel, measuring up to 3 cm. Possible transition point in the right upper quadrant anterior to the liver. Decompressed distal small bowel and colon. Appendix non identified. No colon wall thickening. Sigmoid colon diverticular disease. Vascular/Lymphatic: Aortic atherosclerosis. No enlarged abdominal or pelvic lymph nodes. IVC filter in the infrarenal IVC. Reproductive: Uterus and bilateral adnexa are unremarkable. Other: Small free fluid in the pelvis. No free air. Fat in the umbilicus. Musculoskeletal: No acute or suspicious bone lesion. IMPRESSION: 1. There are multiple borderline to slightly enlarged fluid-filled loops of small bowel with collapsed distal small bowel in the right upper quadrant, findings are concerning for a developing bowel obstruction. 2. Sigmoid colon diverticular disease without acute inflammation. 3. Small amount of free fluid in the pelvis Electronically Signed   By: Jasmine Pang M.D.   On: 11/25/2016 03:47     EKG: Independently reviewed.  Sinus rhythm, QTC 450, LAD, early R-wave progression, mild T-wave inversion in lead V3-V5.  Assessment/Plan Principal Problem:   SBO (small bowel obstruction) (HCC) Active Problems:   Seizures (HCC)   Hypothyroidism   CKD (chronic kidney disease), stage III   SBO (small bowel obstruction) (HCC): as shown by CT scan. Patient has mild leukocytosis, but no fever. Clinically nonseptic. Hemodynamically stable. Surgeon, Dr. Derrell Lolling was consulted, will see in AM.  -Admit to tele bed -NPO  -prn NG tube -morphine prn pain -Prn Zofran prn nausea  -IVF: 2.5 L NS and then 100 cc/h -INR/PTT/type & screen  Seizures (HCC): -Seizure precaution -Switch oral  Dilantin to IV - check Dilantin level  Hypothyroidism: Last TSH was not on record -swicth oral synthroid to --> decreased dose from 50-->25 g daily  CKD (chronic kidney disease), stage III: Close to baseline. Baseline creatinine 1.13 on 11/26/13. Her creatinine is 1.23, BUN 11. -Follow-up renal function by BMP  DVT ppx: SCD Code Status: Full code Family Communication: Yes, patient's husband at bed side Disposition Plan:  Anticipate discharge back to previous home environment Consults called:  None Admission status:  Inpatient/tele     Date of Service 11/25/2016    Lorretta Harp [facility] Pager [phone]  If 7PM-7AM, please contact night-coverage [email or web address] Password [email or web address] 11/25/2016, 5:07 AM

## 2016-11-25 NOTE — ED Notes (Signed)
Pt unable to relax to get NG tube inserted. Floor RN notified

## 2016-11-25 NOTE — ED Provider Notes (Signed)
[facility]-EMERGENCY DEPT Provider Note   CSN: [id number] Arrival date & time: 11/24/16  2150   By signing my name below, I, Clarisse GougeXavier Herndon, attest that this documentation has been prepared under the direction and in the presence of Zadie RhineWickline, Odin Mariani, MD. Electronically signed, Clarisse GougeXavier Herndon, ED Scribe. 11/25/16. 1:30 AM.   History   Chief Complaint Chief Complaint  Patient presents with  . Chest Pain  . Emesis   The history is provided by the patient, medical records and the spouse. No language interpreter was used.  Chest Pain   This is a recurrent problem. The current episode started 3 to 5 hours ago. The problem occurs constantly. The problem has been gradually worsening. The pain is present in the epigastric region. The pain is at a severity of 5/10. The pain is moderate. The pain does not radiate. Associated symptoms include abdominal pain, headaches, malaise/fatigue, nausea, shortness of breath and vomiting. Pertinent negatives include no fever, no near-syncope, no numbness, no palpitations and no weakness. Syncope: unsure. Risk factors include being elderly.  Emesis   This is a new problem. The current episode started 3 to 5 hours ago. The problem occurs 5 to 10 times per day. The emesis has an appearance of stomach contents. There has been no fever. Associated symptoms include abdominal pain, arthralgias, headaches and myalgias. Pertinent negatives include no fever.    Karla [name] is a 61 y.o. female with h/o lupus, endocarditis, cardiac arrest and thyroid disease presenting to the Emergency Department with chief complaint of lower chest pain x > 4 hours. Pt contributes very little to history; husband presents most of HPI on evaluation. Associated epigastric pain noted. 5 episodes of vomiting starting ~9:30 PM last night, lingering nausea, generalized pains, headache and fatigue noted. 5/10 epigastric/ lower chest pain described in triage. No modifying factors. When asked about seizure  activity, husband states the pt had an episode that he would not describe as a seizure. Husband states pt following prescribed medications at home. He adds h/o thyroid disease and staph endocarditis years ago. Husband states pt has had seizures since staph endocarditis diagnosis/ treatment. No h/o heart attack, stroke or abdominal surgeries besides appendectomy noted. No SOB, bloody vomit, fever, back pain, weakness, recent traumas or injuries noted. PCP noted for F/U. No other complaints at this time.   Past Medical History:  Diagnosis Date  . Cardiac arrest (HCC) 2004  . Endocarditis   . Lupus   . Seizures (HCC)   . Thyroid disease    hypothyroidism    There are no active problems to display for this patient.   Past Surgical History:  Procedure Laterality Date  . APPENDECTOMY    . CESAREAN SECTION    . PATELLA FRACTURE SURGERY    . TUBAL LIGATION      OB History    No data available       Home Medications    Prior to Admission medications   Medication Sig Start Date End Date Taking? Authorizing Provider  aspirin 81 MG tablet Take 324 mg by mouth once.    [provider]  Calcium Carb-Cholecalciferol (CALCIUM 600 + D PO) Take 1 tablet by mouth daily.    [provider]  levothyroxine (SYNTHROID, LEVOTHROID) 100 MCG tablet Take 50 mcg by mouth daily before breakfast.    [provider]  Multiple Vitamins-Minerals (MULTIVITAMIN WITH MINERALS) tablet Take 1 tablet by mouth daily.    [provider]  phenytoin (DILANTIN) 100 MG ER capsule Take  100-200 mg by mouth 3 (three) times daily. Take 100 mg 3 times a day every wed,thur,friday & sat then 200 mg 3 times a day every mon, tues,sunday    [provider]    Family History History reviewed. No pertinent family history.  Social History Social History  Substance Use Topics  . Smoking status: Never Smoker  . Smokeless tobacco: Never Used  . Alcohol use No     Allergies     Patient has no known allergies.   Review of Systems Review of Systems  Constitutional: Positive for malaise/fatigue. Negative for fever.  Respiratory: Positive for shortness of breath.   Cardiovascular: Positive for chest pain. Negative for palpitations and near-syncope. Syncope: unsure.  Gastrointestinal: Positive for abdominal pain, nausea and vomiting.  Musculoskeletal: Positive for arthralgias and myalgias.  Neurological: Positive for headaches. Negative for weakness and numbness.  All other systems reviewed and are negative.    Physical Exam Updated Vital Signs BP 119/80 (BP Location: Left Arm)   Pulse 70   Temp 97.8 F (36.6 C) (Oral)   Resp 16   Ht 5\' 3"  (1.6 m)   Wt 150 lb (68 kg)   SpO2 98%   BMI 26.57 kg/m   Physical Exam CONSTITUTIONAL: chronically ill appearing HEAD: Normocephalic/atraumatic EYES: EOMI/PERRL, no icterus ENMT: Mucous membranes dry NECK: supple no meningeal signs SPINE/BACK:entire spine nontender CV: S1/S2 noted, no murmurs/rubs/gallops noted LUNGS: Lungs are clear to auscultation bilaterally, no apparent distress ABDOMEN: soft, moderate tenderness to RUQ and RLQ, no rebound or guarding, bowel sounds noted throughout abdomen GU:no cva tenderness NEURO: Pt is awake/alert/appropriate, moves all extremitiesx4.  No facial droop. Pt slow to respond but answers appropriately. EXTREMITIES: pulses normal/equal, full ROM SKIN: warm, color normal PSYCH: flat affect  ED Treatments / Results  DIAGNOSTIC STUDIES: Oxygen Saturation is 98% on RA, NL by my interpretation.    COORDINATION OF CARE: 1:23 AM-Discussed next steps with pt. Pt verbalized understanding and is agreeable with the plan. Will order medications, imaging and labs. Admission pending following further workup.   Labs (all labs ordered are listed, but only abnormal results are displayed) Labs Reviewed  BASIC METABOLIC PANEL - Abnormal; Notable for the following:       Result Value    Sodium 134 (*)    Chloride 97 (*)    Glucose, Bld 160 (*)    Creatinine, Ser 1.23 (*)    GFR calc non Af Amer 47 (*)    GFR calc Af Amer 54 (*)    All other components within normal limits  CBC - Abnormal; Notable for the following:    WBC 11.8 (*)    RBC 3.61 (*)    Hemoglobin 11.7 (*)    HCT 35.1 (*)    All other components within normal limits  HEPATIC FUNCTION PANEL - Abnormal; Notable for the following:    Total Protein 8.3 (*)    All other components within normal limits  LIPASE, BLOOD  PHENYTOIN LEVEL, TOTAL  I-STAT TROPOININ, ED  POC OCCULT BLOOD, ED    EKG  EKG Interpretation  Date/Time:  Saturday November 24 2016 22:18:04 EDT Ventricular Rate:  60 PR Interval:  154 QRS Duration: 90 QT Interval:  450 QTC Calculation: 450 R Axis:   -7 Text Interpretation:  Normal sinus rhythm RSR' or QR pattern in V1 suggests right ventricular conduction delay Nonspecific T wave abnormality Abnormal ECG No significant change since last tracing Confirmed by Zadie Rhine ([id number]) on 11/25/2016 1:14:13 AM  Radiology Dg Chest 2 View  Result Date: 11/24/2016 CLINICAL DATA:  Acute onset of generalized chest pain, nausea, vomiting and body aches. Initial encounter. EXAM: CHEST  2 VIEW COMPARISON:  Chest radiograph performed 11/26/2013 FINDINGS: The lungs are well-aerated and clear. There is no evidence of focal opacification, pleural effusion or pneumothorax. The heart is normal in size; the mediastinal contour is within normal limits. No acute osseous abnormalities are seen. Bowel loops are noted interposed superior to the liver. IMPRESSION: No acute cardiopulmonary process seen. Electronically Signed   By: Roanna Raider M.D.   On: 11/24/2016 23:42   Ct Abdomen Pelvis W Contrast  Result Date: 11/25/2016 CLINICAL DATA:  Epigastric pain with vomiting EXAM: CT ABDOMEN AND PELVIS WITH CONTRAST TECHNIQUE: Multidetector CT imaging of the abdomen and pelvis was performed using the standard  protocol following bolus administration of intravenous contrast. CONTRAST:  80mL ISOVUE-300 IOPAMIDOL (ISOVUE-300) INJECTION 61% COMPARISON:  None. FINDINGS: Lower chest: Lung bases demonstrate no acute consolidation or effusion. Patchy dependent atelectasis. Normal heart size. Hepatobiliary: No focal hepatic abnormality. No calcified gallstones or biliary dilatation. Pancreas: Unremarkable. No pancreatic ductal dilatation or surrounding inflammatory changes. Spleen: Normal in size without focal abnormality. Adrenals/Urinary Tract: Adrenal glands are unremarkable. Kidneys are normal, without renal calculi, focal lesion, or hydronephrosis. Bladder is unremarkable. Stomach/Bowel: The stomach is moderately enlarged. There are multiple fluid-filled loops of borderline to slightly enlarged small bowel, measuring up to 3 cm. Possible transition point in the right upper quadrant anterior to the liver. Decompressed distal small bowel and colon. Appendix non identified. No colon wall thickening. Sigmoid colon diverticular disease. Vascular/Lymphatic: Aortic atherosclerosis. No enlarged abdominal or pelvic lymph nodes. IVC filter in the infrarenal IVC. Reproductive: Uterus and bilateral adnexa are unremarkable. Other: Small free fluid in the pelvis. No free air. Fat in the umbilicus. Musculoskeletal: No acute or suspicious bone lesion. IMPRESSION: 1. There are multiple borderline to slightly enlarged fluid-filled loops of small bowel with collapsed distal small bowel in the right upper quadrant, findings are concerning for a developing bowel obstruction. 2. Sigmoid colon diverticular disease without acute inflammation. 3. Small amount of free fluid in the pelvis Electronically Signed   By: Jasmine Pang M.D.   On: 11/25/2016 03:47    Procedures Procedures (including critical care time)  Medications Ordered in ED Medications  sodium chloride 0.9 % bolus 1,000 mL (1,000 mLs Intravenous New Bag/Given 11/25/16 0259)    fentaNYL (SUBLIMAZE) injection 25 mcg (25 mcg Intravenous Given 11/25/16 0300)  ondansetron (ZOFRAN) injection 4 mg (4 mg Intravenous Given 11/25/16 0259)  iopamidol (ISOVUE-300) 61 % injection (80 mLs  Contrast Given 11/25/16 0322)     Initial Impression / Assessment and Plan / ED Course  I have reviewed the triage vital signs and the nursing notes.  Pertinent labs & imaging results that were available during my care of the patient were reviewed by me and considered in my medical decision making (see chart for details).     1:49 AM Pt with CP/ABD pain and vomiting She is ill appearing Currently she has focal ABD Tenderness Will proceed with CT imaging I reviewed xray imaging with dr Cherly Hensen from radiology, no acute findings/no pneumoperitoneum 4:45 AM CT shows SBO Pt awake/alert, with abdominal tenderness No active vomiting Will admit D/w dr Clyde Lundborg for admission to medical service Defer NG tube for now   Final Clinical Impressions(s) / ED Diagnoses   Final diagnoses:  Seizures (HCC)  Hypothyroidism, unspecified type    New Prescriptions New  Prescriptions   No medications on file  I personally performed the services described in this documentation, which was scribed in my presence. The recorded information has been reviewed and is accurate.        Zadie Rhine, MD 11/25/16 [phone]

## 2016-11-25 NOTE — Consult Note (Signed)
[facility] Consult Note  Karla [name] 09/29/1955  [id number].    Requesting MD: Sloan Leiter, MD Chief Complaint/Reason for Consult: SBO HPI:  Karla [name] is a 61 y.o. 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 Pain started less than 24 hours prior to presentation to the hospital. Pain is described as sharp, non-radiating, lower abdominal pain. Denies aggrivating or alleviating factors. Denies similar pain in the past. This morning she states that the pain as completely subsided. Reports 4-5 episodes of non-bloody vomiting. Last BM was Thursday (3 days ago). Last episode of flatus was 1-2 days ago. Past abdominal surgeries include tubal ligation, cesarean section, and appendectomy. Patient She takes 81 mg ASA daily and denies use of other blood thinning medications. She lives at home and performs ADL's independently with some help from her husband/grandon who live with her.   ED Workup significant for WBC 11.8, hgb 11.7 / hct 35.1, LFTs/lipase WNL, this morning lactate was 2.6  CT ABD/PELV W/: fluid filled loops of small bowel in RUQ with borderline dilation (3 cm) and decompressed distal small bowel. Diverticulosis present. Small amount of pelvic free fluid.   ROS: Review of Systems  Cardiovascular: Negative for chest pain.  Gastrointestinal: Positive for abdominal pain, constipation, nausea and vomiting. Negative for blood in stool and melena.  All other systems reviewed and are negative.   Family History  Problem Relation Age of Onset  . Lung cancer Father   . Thyroid disease Sister     Past Medical History:  Diagnosis Date  . Cardiac arrest ([place]) 2004  . Endocarditis   . Hypothyroidism   . Lupus   . Seizures ([place])   . Thyroid disease    hypothyroidism    Past Surgical History:  Procedure Laterality Date  .  APPENDECTOMY    . CESAREAN SECTION    . PATELLA FRACTURE SURGERY    . TUBAL LIGATION      Social History:  reports that she has never smoked. She has never used smokeless tobacco. She reports that she does not drink alcohol or use drugs.  Allergies: No Known Allergies  Medications Prior to Admission  Medication Sig Dispense Refill  . aspirin EC 81 MG tablet Take 81 mg by mouth daily.    . Calcium Carb-Cholecalciferol (CALCIUM 600 + D PO) Take 1 tablet by mouth daily.    Marland Kitchen levothyroxine (SYNTHROID, LEVOTHROID) 100 MCG tablet Take 50 mcg by mouth daily before breakfast.    . Multiple Vitamins-Minerals (MULTIVITAMIN WITH MINERALS) tablet Take 1 tablet by mouth daily.    . phenytoin (DILANTIN) 100 MG ER capsule Take 100-200 mg by mouth See admin instructions. Take 1 capsule at 8 am, 1 pm and then take 2 capsules at beditme      Blood pressure (!) 146/81, pulse 62, temperature 98.1 F (36.7 C), temperature source Oral, resp. rate 18, height '5\' 3"'  (1.6 m), weight 74.7 kg (164 lb 10.9 oz), SpO2 100 %. Physical Exam: Physical Exam  Constitutional: She is oriented to person, place, and time. She appears well-developed and well-nourished. No distress.  HENT:  Head: Normocephalic and atraumatic.  Right Ear: External ear normal.  Left Ear: External ear normal.  Eyes: EOM are normal. No scleral icterus.  Neck: Normal range of motion. Neck supple. No thyromegaly present.  Cardiovascular: Normal rate, regular rhythm, normal heart sounds and intact distal  pulses.  Exam reveals no friction rub.   No murmur heard. Pulmonary/Chest: Effort normal and breath sounds normal. No respiratory distress. She has no wheezes. She has no rales.  Abdominal: Soft. Bowel sounds are normal. She exhibits no distension and no mass. There is no tenderness. There is no rebound and no guarding. A hernia (soft, reducible umbilical hernia) is present.  Musculoskeletal: Normal range of motion. She exhibits no edema or  deformity.  Neurological: She is alert and oriented to person, place, and time. No sensory deficit.  Skin: Skin is warm and dry. No rash noted. She is not diaphoretic.  Psychiatric: Her behavior is normal.  Flat affect.    Results for orders placed or performed during the hospital encounter of 11/25/16 (from the past 48 hour(s))  Basic metabolic panel     Status: Abnormal   Collection Time: 11/24/16 10:26 PM  Result Value Ref Range   Sodium 134 (L) 135 - 145 mmol/L   Potassium 3.7 3.5 - 5.1 mmol/L   Chloride 97 (L) 101 - 111 mmol/L   CO2 25 22 - 32 mmol/L   Glucose, Bld 160 (H) 65 - 99 mg/dL   BUN 11 6 - 20 mg/dL   Creatinine, Ser 1.23 (H) 0.44 - 1.00 mg/dL   Calcium 9.6 8.9 - 10.3 mg/dL   GFR calc non Af Amer 47 (L) >60 mL/min   GFR calc Af Amer 54 (L) >60 mL/min    Comment: (NOTE) The eGFR has been calculated using the CKD EPI equation. This calculation has not been validated in all clinical situations. eGFR's persistently <60 mL/min signify possible Chronic Kidney Disease.    Anion gap 12 5 - 15  CBC     Status: Abnormal   Collection Time: 11/24/16 10:26 PM  Result Value Ref Range   WBC 11.8 (H) 4.0 - 10.5 K/uL   RBC 3.61 (L) 3.87 - 5.11 MIL/uL   Hemoglobin 11.7 (L) 12.0 - 15.0 g/dL   HCT 35.1 (L) 36.0 - 46.0 %   MCV 97.2 78.0 - 100.0 fL   MCH 32.4 26.0 - 34.0 pg   MCHC 33.3 30.0 - 36.0 g/dL   RDW 12.2 11.5 - 15.5 %   Platelets 182 150 - 400 K/uL  I-stat troponin, ED     Status: None   Collection Time: 11/24/16 10:37 PM  Result Value Ref Range   Troponin i, poc 0.01 0.00 - 0.08 ng/mL   Comment 3            Comment: Due to the release kinetics of cTnI, a negative result within the first hours of the onset of symptoms does not rule out myocardial infarction with certainty. If myocardial infarction is still suspected, repeat the test at appropriate intervals.   Lipase, blood     Status: None   Collection Time: 11/25/16 12:17 AM  Result Value Ref Range   Lipase  35 11 - 51 U/L  Hepatic function panel     Status: Abnormal   Collection Time: 11/25/16 12:17 AM  Result Value Ref Range   Total Protein 8.3 (H) 6.5 - 8.1 g/dL   Albumin 4.3 3.5 - 5.0 g/dL   AST 25 15 - 41 U/L   ALT 17 14 - 54 U/L   Alkaline Phosphatase 106 38 - 126 U/L   Total Bilirubin 0.6 0.3 - 1.2 mg/dL   Bilirubin, Direct 0.2 0.1 - 0.5 mg/dL   Indirect Bilirubin 0.4 0.3 - 0.9 mg/dL  Phenytoin level, total  Status: Abnormal   Collection Time: 11/25/16  2:30 AM  Result Value Ref Range   Phenytoin Lvl 5.6 (L) 10.0 - 20.0 ug/mL  Troponin I (q 6hr x 3)     Status: None   Collection Time: 11/25/16  4:58 AM  Result Value Ref Range   Troponin I <0.03 <0.03 ng/mL  Protime-INR     Status: None   Collection Time: 11/25/16  4:58 AM  Result Value Ref Range   Prothrombin Time 14.2 11.4 - 15.2 seconds   INR 1.09   APTT     Status: Abnormal   Collection Time: 11/25/16  4:58 AM  Result Value Ref Range   aPTT 47 (H) 24 - 36 seconds    Comment:        IF BASELINE aPTT IS ELEVATED, SUGGEST PATIENT RISK ASSESSMENT BE USED TO DETERMINE APPROPRIATE ANTICOAGULANT THERAPY.   Lactic acid, plasma     Status: Abnormal   Collection Time: 11/25/16  4:58 AM  Result Value Ref Range   Lactic Acid, Venous 2.6 (HH) 0.5 - 1.9 mmol/L    Comment: CRITICAL RESULT CALLED TO, READ BACK BY AND VERIFIED WITH: HILL,J RN 11/25/2016 0624 JORDANS   Basic metabolic panel     Status: Abnormal   Collection Time: 11/25/16  4:58 AM  Result Value Ref Range   Sodium 138 135 - 145 mmol/L   Potassium 4.5 3.5 - 5.1 mmol/L    Comment: DELTA CHECK NOTED   Chloride 99 (L) 101 - 111 mmol/L   CO2 29 22 - 32 mmol/L   Glucose, Bld 128 (H) 65 - 99 mg/dL   BUN 13 6 - 20 mg/dL   Creatinine, Ser 1.23 (H) 0.44 - 1.00 mg/dL   Calcium 9.5 8.9 - 10.3 mg/dL   GFR calc non Af Amer 47 (L) >60 mL/min   GFR calc Af Amer 54 (L) >60 mL/min    Comment: (NOTE) The eGFR has been calculated using the CKD EPI equation. This  calculation has not been validated in all clinical situations. eGFR's persistently <60 mL/min signify possible Chronic Kidney Disease.    Anion gap 10 5 - 15  CBC     Status: Abnormal   Collection Time: 11/25/16  4:58 AM  Result Value Ref Range   WBC 9.9 4.0 - 10.5 K/uL   RBC 3.85 (L) 3.87 - 5.11 MIL/uL   Hemoglobin 12.9 12.0 - 15.0 g/dL   HCT 38.1 36.0 - 46.0 %   MCV 99.0 78.0 - 100.0 fL   MCH 33.5 26.0 - 34.0 pg   MCHC 33.9 30.0 - 36.0 g/dL   RDW 12.5 11.5 - 15.5 %   Platelets 155 150 - 400 K/uL  Type and screen [place]     Status: None   Collection Time: 11/25/16  5:15 AM  Result Value Ref Range   ABO/RH(D) A POS    Antibody Screen NEG    Sample Expiration 11/28/2016   ABO/Rh     Status: None (Preliminary result)   Collection Time: 11/25/16  5:15 AM  Result Value Ref Range   ABO/RH(D) A POS    Dg Chest 2 View  Result Date: 11/24/2016 CLINICAL DATA:  Acute onset of generalized chest pain, nausea, vomiting and body aches. Initial encounter. EXAM: CHEST  2 VIEW COMPARISON:  Chest radiograph performed 11/26/2013 FINDINGS: The lungs are well-aerated and clear. There is no evidence of focal opacification, pleural effusion or pneumothorax. The heart is normal in size; the mediastinal  contour is within normal limits. No acute osseous abnormalities are seen. Bowel loops are noted interposed superior to the liver. IMPRESSION: No acute cardiopulmonary process seen. Electronically Signed   By: Garald Balding M.D.   On: 11/24/2016 23:42   Ct Abdomen Pelvis W Contrast  Result Date: 11/25/2016 CLINICAL DATA:  Epigastric pain with vomiting EXAM: CT ABDOMEN AND PELVIS WITH CONTRAST TECHNIQUE: Multidetector CT imaging of the abdomen and pelvis was performed using the standard protocol following bolus administration of intravenous contrast. CONTRAST:  22m ISOVUE-300 IOPAMIDOL (ISOVUE-300) INJECTION 61% COMPARISON:  None. FINDINGS: Lower chest: Lung bases demonstrate no acute  consolidation or effusion. Patchy dependent atelectasis. Normal heart size. Hepatobiliary: No focal hepatic abnormality. No calcified gallstones or biliary dilatation. Pancreas: Unremarkable. No pancreatic ductal dilatation or surrounding inflammatory changes. Spleen: Normal in size without focal abnormality. Adrenals/Urinary Tract: Adrenal glands are unremarkable. Kidneys are normal, without renal calculi, focal lesion, or hydronephrosis. Bladder is unremarkable. Stomach/Bowel: The stomach is moderately enlarged. There are multiple fluid-filled loops of borderline to slightly enlarged small bowel, measuring up to 3 cm. Possible transition point in the right upper quadrant anterior to the liver. Decompressed distal small bowel and colon. Appendix non identified. No colon wall thickening. Sigmoid colon diverticular disease. Vascular/Lymphatic: Aortic atherosclerosis. No enlarged abdominal or pelvic lymph nodes. IVC filter in the infrarenal IVC. Reproductive: Uterus and bilateral adnexa are unremarkable. Other: Small free fluid in the pelvis. No free air. Fat in the umbilicus. Musculoskeletal: No acute or suspicious bone lesion. IMPRESSION: 1. There are multiple borderline to slightly enlarged fluid-filled loops of small bowel with collapsed distal small bowel in the right upper quadrant, findings are concerning for a developing bowel obstruction. 2. Sigmoid colon diverticular disease without acute inflammation. 3. Small amount of free fluid in the pelvis Electronically Signed   By: KDonavan FoilM.D.   On: 11/25/2016 03:47   Assessment/Plan SBO  - suspect 2/2 intra-abdominal adhesions from previous surgeries - denies abdominal pain, nausea, or vomiting this morning.  - CT scan with mild air/fluid levels in RUQ but not significant for high grade SBO. - I have ordered a follow up abdominal film this morning.  - NPO, bowel rest, IVF, placement of NG tube if vomiting recurs but hold off on NGT for now. -  dulcolax to encourage bowel function    EJill Alexanders PHastings [facility] 11/25/2016, 7:58 AM Pager: [phone]: [phone]-Fri 7:00 am-4:30 pm Sat-Sun 7:00 am-11:30 am

## 2016-11-25 NOTE — Progress Notes (Signed)
Admitted earlier this morning by my partner Dr. Clyde LundborgNiu  61 year old female with history of seizures, hypothyroidism, see daily stage III who presented with abdominal pain and nausea/vomiting. Found to have probable SBO-and on admitted to the hospitalist service.  Subjective: Feels better-denies any abdominal pain or vomiting this morning.  On exam: Abdomen-soft and nontender, nondistended  Assessment/plan Probable SBO-appreciate general surgery input-continue bowel rest and other supportive measures. Agree with labs as outlined by Dr. Clyde LundborgNiu

## 2016-11-26 ENCOUNTER — Inpatient Hospital Stay (HOSPITAL_COMMUNITY): Payer: Medicaid Other

## 2016-11-26 DIAGNOSIS — N183 Chronic kidney disease, stage 3 (moderate): Secondary | ICD-10-CM

## 2016-11-26 DIAGNOSIS — Z0189 Encounter for other specified special examinations: Secondary | ICD-10-CM

## 2016-11-26 LAB — CBC
HEMATOCRIT: 32.8 % — AB (ref 36.0–46.0)
Hemoglobin: 10.6 g/dL — ABNORMAL LOW (ref 12.0–15.0)
MCH: 32.3 pg (ref 26.0–34.0)
MCHC: 32.3 g/dL (ref 30.0–36.0)
MCV: 100 fL (ref 78.0–100.0)
Platelets: 144 10*3/uL — ABNORMAL LOW (ref 150–400)
RBC: 3.28 MIL/uL — ABNORMAL LOW (ref 3.87–5.11)
RDW: 12.5 % (ref 11.5–15.5)
WBC: 7.2 10*3/uL (ref 4.0–10.5)

## 2016-11-26 LAB — BASIC METABOLIC PANEL
ANION GAP: 7 (ref 5–15)
BUN: 16 mg/dL (ref 6–20)
CALCIUM: 8.3 mg/dL — AB (ref 8.9–10.3)
CO2: 26 mmol/L (ref 22–32)
CREATININE: 1.17 mg/dL — AB (ref 0.44–1.00)
Chloride: 108 mmol/L (ref 101–111)
GFR calc Af Amer: 57 mL/min — ABNORMAL LOW (ref >60)
GFR calc non Af Amer: 50 mL/min — ABNORMAL LOW (ref >60)
GLUCOSE: 93 mg/dL (ref 65–99)
Potassium: 4 mmol/L (ref 3.5–5.1)
Sodium: 141 mmol/L (ref 135–145)

## 2016-11-26 LAB — MAGNESIUM: Magnesium: 2 mg/dL (ref 1.7–2.4)

## 2016-11-26 LAB — T4, FREE: FREE T4: 0.95 ng/dL (ref 0.61–1.12)

## 2016-11-26 LAB — TSH: TSH: 1.42 u[IU]/mL (ref 0.350–4.500)

## 2016-11-26 NOTE — Progress Notes (Signed)
Patient ID: Karla [name], female   DOB: [birth year], 61 y.o.   MRN: [id number]  [facility] Surgery Progress Note     Subjective: CC- SBO Patient states that she is feeling much better today. She reports little to no abdominal pain. Denies n/v. She is passing small amount of flatus, no BM. Has not ambulated since admission.  Objective: Vital signs in last 24 hours: Temp:  [98.6 F (37 C)-99.2 F (37.3 C)] 99.2 F (37.3 C) (06/18 0534) Pulse Rate:  [64-70] 64 (06/18 0534) Resp:  [17-18] 18 (06/18 0534) BP: (105-142)/(61-70) 118/61 (06/18 0534) SpO2:  [95 %-100 %] 96 % (06/18 0534) Last BM Date: 11/22/16  Intake/Output from previous day: 06/17 0701 - 06/18 0700 In: 1635 [I.V.:1635] Out: 800 [Urine:800] Intake/Output this shift: Total I/O In: -  Out: 750 [Emesis/NG output:750]  PE: Gen:  Alert, NAD, pleasant HEENT: EOM's intact, pupils equal  Card:  RRR, no M/G/R heard Pulm:  CTAB, no W/R/R, effort normal Abd: Soft, mild distension, +BS, no HSM, no hernia, nontender Ext:  No erythema, edema, or tenderness BUE/BLE  Psych: A&Ox3   Lab Results:   Recent Labs  11/25/16 0458 11/26/16 0603  WBC 9.9 7.2  HGB 12.9 10.6*  HCT 38.1 32.8*  PLT 155 144*   BMET  Recent Labs  11/25/16 0458 11/26/16 0603  NA 138 141  K 4.5 4.0  CL 99* 108  CO2 29 26  GLUCOSE 128* 93  BUN 13 16  CREATININE 1.23* 1.17*  CALCIUM 9.5 8.3*   PT/INR  Recent Labs  11/25/16 0458  LABPROT 14.2  INR 1.09   CMP     Component Value Date/Time   NA 141 11/26/2016 0603   K 4.0 11/26/2016 0603   CL 108 11/26/2016 0603   CO2 26 11/26/2016 0603   GLUCOSE 93 11/26/2016 0603   BUN 16 11/26/2016 0603   CREATININE 1.17 (H) 11/26/2016 0603   CALCIUM 8.3 (L) 11/26/2016 0603   PROT 8.3 (H) 11/25/2016 0017   ALBUMIN 4.3 11/25/2016 0017   AST 25 11/25/2016 0017   ALT 17 11/25/2016 0017   ALKPHOS 106 11/25/2016 0017   BILITOT 0.6 11/25/2016 0017   GFRNONAA 50 (L) 11/26/2016 0603    GFRAA 57 (L) 11/26/2016 0603   Lipase     Component Value Date/Time   LIPASE 35 11/25/2016 0017       Studies/Results: Dg Chest 2 View  Result Date: 11/24/2016 CLINICAL DATA:  Acute onset of generalized chest pain, nausea, vomiting and body aches. Initial encounter. EXAM: CHEST  2 VIEW COMPARISON:  Chest radiograph performed 11/26/2013 FINDINGS: The lungs are well-aerated and clear. There is no evidence of focal opacification, pleural effusion or pneumothorax. The heart is normal in size; the mediastinal contour is within normal limits. No acute osseous abnormalities are seen. Bowel loops are noted interposed superior to the liver. IMPRESSION: No acute cardiopulmonary process seen. Electronically Signed   By: Roanna Raider M.D.   On: 11/24/2016 23:42   Ct Abdomen Pelvis W Contrast  Result Date: 11/25/2016 CLINICAL DATA:  Epigastric pain with vomiting EXAM: CT ABDOMEN AND PELVIS WITH CONTRAST TECHNIQUE: Multidetector CT imaging of the abdomen and pelvis was performed using the standard protocol following bolus administration of intravenous contrast. CONTRAST:  80mL ISOVUE-300 IOPAMIDOL (ISOVUE-300) INJECTION 61% COMPARISON:  None. FINDINGS: Lower chest: Lung bases demonstrate no acute consolidation or effusion. Patchy dependent atelectasis. Normal heart size. Hepatobiliary: No focal hepatic abnormality. No calcified gallstones or biliary dilatation. Pancreas: Unremarkable. No  pancreatic ductal dilatation or surrounding inflammatory changes. Spleen: Normal in size without focal abnormality. Adrenals/Urinary Tract: Adrenal glands are unremarkable. Kidneys are normal, without renal calculi, focal lesion, or hydronephrosis. Bladder is unremarkable. Stomach/Bowel: The stomach is moderately enlarged. There are multiple fluid-filled loops of borderline to slightly enlarged small bowel, measuring up to 3 cm. Possible transition point in the right upper quadrant anterior to the liver. Decompressed distal  small bowel and colon. Appendix non identified. No colon wall thickening. Sigmoid colon diverticular disease. Vascular/Lymphatic: Aortic atherosclerosis. No enlarged abdominal or pelvic lymph nodes. IVC filter in the infrarenal IVC. Reproductive: Uterus and bilateral adnexa are unremarkable. Other: Small free fluid in the pelvis. No free air. Fat in the umbilicus. Musculoskeletal: No acute or suspicious bone lesion. IMPRESSION: 1. There are multiple borderline to slightly enlarged fluid-filled loops of small bowel with collapsed distal small bowel in the right upper quadrant, findings are concerning for a developing bowel obstruction. 2. Sigmoid colon diverticular disease without acute inflammation. 3. Small amount of free fluid in the pelvis Electronically Signed   By: Jasmine Pang M.D.   On: 11/25/2016 03:47   Dg Abd 2 Views  Result Date: 11/25/2016 CLINICAL DATA:  Small bowel obstruction. EXAM: ABDOMEN - 2 VIEW COMPARISON:  CT of the abdomen and pelvis on 11/25/2016 FINDINGS: Beneath the right hemidiaphragm, there is an air-fluid collection which may be within a loop of bowel based on prior CT exam. However is difficult to exclude free intraperitoneal air in the setting of obstruction. There are dilated loops of small bowel within the central abdomen, associated with air-fluid levels on the erect view. The colon is not dilated. There is residual contrast in colon. Contrast is also identified within the bladder following recent CT exam. IVC filter overlies L3. IMPRESSION: 1. Persistent small bowel obstruction. 2. Cannot entirely exclude free intraperitoneal air. 3. A left lateral decubitus view of the abdomen is recommended for further evaluation. These results will be called to the ordering clinician or representative by the Radiologist Assistant, and communication documented in the PACS or zVision Dashboard. Electronically Signed   By: Norva Pavlov M.D.   On: 11/25/2016 14:18   Dg Abd Portable  1v  Result Date: 11/26/2016 CLINICAL DATA:  8 hour small-bowel followup EXAM: PORTABLE ABDOMEN - 1 VIEW COMPARISON:  11/25/2016 FINDINGS: Nasogastric catheter remains in the stomach. Previously administered contrast material lies predominately within the colon although some remains in the small bowel. Mild persistent small bowel dilatation is seen although overall improved from the previous exam. An IVC filter is again noted in place. IMPRESSION: Previously administered contrast now lies within the colon. Mild improvement in the degree of small bowel dilatation. Electronically Signed   By: Alcide Clever M.D.   On: 11/26/2016 09:41   Dg Abd Portable 1v-small Bowel Protocol-position Verification  Result Date: 11/25/2016 CLINICAL DATA:  61 year old female status post NG tube placement. EXAM: PORTABLE ABDOMEN - 1 VIEW COMPARISON:  Radiograph dated 11/25/2016 FINDINGS: There has been interval placement of an enteric tube with sideport in the proximal stomach and tip in the region of the body of the stomach. Multiple dilated air-filled loops of small bowel again noted measuring up to 4.1 cm in the mid abdomen. No definite free air identified. An IVC filter is again seen. Partially visualized distended urinary bladder containing excreted intravenous contrast. IMPRESSION: Interval placement of an enteric tube with tip in the gastric body. Persistent dilatation of small-bowel loops. Electronically Signed   By: Ceasar Mons.D.  On: 11/25/2016 23:29    Anti-infectives: Anti-infectives    None       Assessment/Plan CKD-III Hypothyroidism H/o seizures  SBO  - suspect 2/2 intra-abdominal adhesions from previous surgeries (c section, tubal ligation, appendectomy) - CT scan with mild air/fluid levels in RUQ but not significant for high grade SBO. - XR this AM showed contrast in colon and less bowel diation - NG tube with 750cc output since placement  ID - none FEN - clamp NGT and give sips of  clears VTE - SCDs, lovenox  Plan - clamp NG tube and give sips of clear liquids from the floor. Please ambulate with patient. If tolerating sips of clears will d/c NG tube later today.   LOS: 1 day    Edson SnowballBROOKE A [name] , [facility] 11/26/2016, 10:05 AM Pager: [phone] Consults: [phone] Mon-Fri 7:00 am-4:30 pm Sat-Sun 7:00 am-11:30 am

## 2016-11-26 NOTE — Progress Notes (Signed)
[facility] PROGRESS NOTE  Karla [name] ZOX:[id number] DOB: 08-Feb-1956 DOA: 11/25/2016   PCP: Patient, No Pcp Per     Assessment/Plan: Principal Problem:   SBO (small bowel obstruction) (HCC) Active Problems:   Seizures (HCC)   Hypothyroidism   CKD (chronic kidney disease), stage III   Encounter for imaging study to confirm nasogastric (NG) tube placement   61 y.o. Female with a PMH endocarditis, cardiac arrest, CKD III, hypothyroidism, and seizure, who presented to [facility] on 11/25/16 with a cc of abdominal pain associated with nausea and vomiting. Pain started less than 24 hours prior to presentation to the hospital. CT ABD/PELV W/: fluid filled loops of small bowel in RUQ with borderline dilation (3 cm) and decompressed distal small bowel. Diverticulosis present. Small amount of pelvic free fluid.   Assessment and plan   SBO (small bowel obstruction) (HCC): as shown by CT scan. Patient has mild leukocytosis, but no fever. Clinically nonseptic. Hemodynamically stable.  General surgery following Patient had NG tube placed 6/17. SBO protocol initiated Continue telemetry -Clamp NG tube,750 cc output since placement -Minimize narcotics -Prn Zofran prn nausea  Patient will be started on trial of clear liquids Repeat KUB shows contrast in the colon    Seizures (HCC): -Seizure precaution -Continue Dilantin   IV -  Dilantin level 5.6  Hypothyroidism:   TSH 1.42, free T4 0.95 -swicth oral synthroid to --> decreased dose from 50-->25 g daily  CKD (chronic kidney disease), stage III: Close to baseline. Baseline creatinine 1.13 on 11/26/13. Her creatinine is 1.17 ,   -Follow-up renal function by BMP   DVT prophylaxsis Lovenox  Code Status:  Full code    Family Communication: Discussed in detail with the patient, all imaging results, lab results explained to the patient   Disposition Plan:  Pending clinical improvement       Consultants:  General surgery  Procedures:  None  Antibiotics: Anti-infectives    None         HPI/Subjective: Patient states that she is passing flatus , denies nausea  Objective: Vitals:   11/25/16 1548 11/25/16 2012 11/25/16 2211 11/26/16 0534  BP: 120/70 (!) 142/70 105/68 118/61  Pulse: 66 70 70 64  Resp: 17  18 18   Temp: 98.6 F (37 C)  99.1 F (37.3 C) 99.2 F (37.3 C)  TempSrc: Oral     SpO2: 100% 96% 95% 96%  Weight:      Height:        Intake/Output Summary (Last 24 hours) at 11/26/16 0850 Last data filed at 11/26/16 0810  Gross per 24 hour  Intake             1635 ml  Output             1550 ml  Net               85 ml    Exam:  Examination:  General exam: NG tube in place Respiratory system: Clear to auscultation. Respiratory effort normal. Cardiovascular system: S1 & S2 heard, RRR. No JVD, murmurs, rubs, gallops or clicks. No pedal edema. Gastrointestinal system: Decreased bowel sounds , Abdomen nontender. No organomegaly or masses felt. Central nervous system: Alert and oriented. No focal neurological deficits. Extremities: Symmetric 5 x 5 power. Skin: No rashes, lesions or ulcers Psychiatry: Judgement and insight appear normal. Mood & affect appropriate.     Data Reviewed: I have personally reviewed following labs and imaging studies  Micro Results No results found for this or any previous visit (from the past 240 hour(s)).  Radiology Reports Dg Chest 2 View  Result Date: 11/24/2016 CLINICAL DATA:  Acute onset of generalized chest pain, nausea, vomiting and body aches. Initial encounter. EXAM: CHEST  2 VIEW COMPARISON:  Chest radiograph performed 11/26/2013 FINDINGS: The lungs are well-aerated and clear. There is no evidence of focal opacification, pleural effusion or pneumothorax. The heart is normal in size; the mediastinal contour is within normal limits. No acute osseous abnormalities are seen. Bowel loops are noted interposed  superior to the liver. IMPRESSION: No acute cardiopulmonary process seen. Electronically Signed   By: Roanna Raider M.D.   On: 11/24/2016 23:42   Ct Abdomen Pelvis W Contrast  Result Date: 11/25/2016 CLINICAL DATA:  Epigastric pain with vomiting EXAM: CT ABDOMEN AND PELVIS WITH CONTRAST TECHNIQUE: Multidetector CT imaging of the abdomen and pelvis was performed using the standard protocol following bolus administration of intravenous contrast. CONTRAST:  80mL ISOVUE-300 IOPAMIDOL (ISOVUE-300) INJECTION 61% COMPARISON:  None. FINDINGS: Lower chest: Lung bases demonstrate no acute consolidation or effusion. Patchy dependent atelectasis. Normal heart size. Hepatobiliary: No focal hepatic abnormality. No calcified gallstones or biliary dilatation. Pancreas: Unremarkable. No pancreatic ductal dilatation or surrounding inflammatory changes. Spleen: Normal in size without focal abnormality. Adrenals/Urinary Tract: Adrenal glands are unremarkable. Kidneys are normal, without renal calculi, focal lesion, or hydronephrosis. Bladder is unremarkable. Stomach/Bowel: The stomach is moderately enlarged. There are multiple fluid-filled loops of borderline to slightly enlarged small bowel, measuring up to 3 cm. Possible transition point in the right upper quadrant anterior to the liver. Decompressed distal small bowel and colon. Appendix non identified. No colon wall thickening. Sigmoid colon diverticular disease. Vascular/Lymphatic: Aortic atherosclerosis. No enlarged abdominal or pelvic lymph nodes. IVC filter in the infrarenal IVC. Reproductive: Uterus and bilateral adnexa are unremarkable. Other: Small free fluid in the pelvis. No free air. Fat in the umbilicus. Musculoskeletal: No acute or suspicious bone lesion. IMPRESSION: 1. There are multiple borderline to slightly enlarged fluid-filled loops of small bowel with collapsed distal small bowel in the right upper quadrant, findings are concerning for a developing bowel  obstruction. 2. Sigmoid colon diverticular disease without acute inflammation. 3. Small amount of free fluid in the pelvis Electronically Signed   By: Jasmine Pang M.D.   On: 11/25/2016 03:47   Dg Abd 2 Views  Result Date: 11/25/2016 CLINICAL DATA:  Small bowel obstruction. EXAM: ABDOMEN - 2 VIEW COMPARISON:  CT of the abdomen and pelvis on 11/25/2016 FINDINGS: Beneath the right hemidiaphragm, there is an air-fluid collection which may be within a loop of bowel based on prior CT exam. However is difficult to exclude free intraperitoneal air in the setting of obstruction. There are dilated loops of small bowel within the central abdomen, associated with air-fluid levels on the erect view. The colon is not dilated. There is residual contrast in colon. Contrast is also identified within the bladder following recent CT exam. IVC filter overlies L3. IMPRESSION: 1. Persistent small bowel obstruction. 2. Cannot entirely exclude free intraperitoneal air. 3. A left lateral decubitus view of the abdomen is recommended for further evaluation. These results will be called to the ordering clinician or representative by the Radiologist Assistant, and communication documented in the PACS or zVision Dashboard. Electronically Signed   By: Norva Pavlov M.D.   On: 11/25/2016 14:18   Dg Abd Portable 1v-small Bowel Protocol-position Verification  Result Date: 11/25/2016 CLINICAL DATA:  61 year old female status post NG  tube placement. EXAM: PORTABLE ABDOMEN - 1 VIEW COMPARISON:  Radiograph dated 11/25/2016 FINDINGS: There has been interval placement of an enteric tube with sideport in the proximal stomach and tip in the region of the body of the stomach. Multiple dilated air-filled loops of small bowel again noted measuring up to 4.1 cm in the mid abdomen. No definite free air identified. An IVC filter is again seen. Partially visualized distended urinary bladder containing excreted intravenous contrast. IMPRESSION:  Interval placement of an enteric tube with tip in the gastric body. Persistent dilatation of small-bowel loops. Electronically Signed   By: Elgie Collard M.D.   On: 11/25/2016 23:29     CBC  Recent Labs Lab 11/24/16 2226 11/25/16 0458 11/26/16 0603  WBC 11.8* 9.9 7.2  HGB 11.7* 12.9 10.6*  HCT 35.1* 38.1 32.8*  PLT 182 155 144*  MCV 97.2 99.0 100.0  MCH 32.4 33.5 32.3  MCHC 33.3 33.9 32.3  RDW 12.2 12.5 12.5    Chemistries   Recent Labs Lab 11/24/16 2226 11/25/16 0017 11/25/16 0458 11/26/16 0603  NA 134*  --  138 141  K 3.7  --  4.5 4.0  CL 97*  --  99* 108  CO2 25  --  29 26  GLUCOSE 160*  --  128* 93  BUN 11  --  13 16  CREATININE 1.23*  --  1.23* 1.17*  CALCIUM 9.6  --  9.5 8.3*  AST  --  25  --   --   ALT  --  17  --   --   ALKPHOS  --  106  --   --   BILITOT  --  0.6  --   --    ------------------------------------------------------------------------------------------------------------------ estimated creatinine clearance is 49.5 mL/min (A) (by C-G formula based on SCr of 1.17 mg/dL (H)). ------------------------------------------------------------------------------------------------------------------ No results for input(s): HGBA1C in the last 72 hours. ------------------------------------------------------------------------------------------------------------------ No results for input(s): CHOL, HDL, LDLCALC, TRIG, CHOLHDL, LDLDIRECT in the last 72 hours. ------------------------------------------------------------------------------------------------------------------ No results for input(s): TSH, T4TOTAL, T3FREE, THYROIDAB in the last 72 hours.  Invalid input(s): FREET3 ------------------------------------------------------------------------------------------------------------------ No results for input(s): VITAMINB12, FOLATE, FERRITIN, TIBC, IRON, RETICCTPCT in the last 72 hours.  Coagulation profile  Recent Labs Lab 11/25/16 0458  INR 1.09     No results for input(s): DDIMER in the last 72 hours.  Cardiac Enzymes  Recent Labs Lab 11/25/16 0458 11/25/16 0840 11/25/16 1616  TROPONINI <0.03 <0.03 <0.03   ------------------------------------------------------------------------------------------------------------------ Invalid input(s): POCBNP   CBG: No results for input(s): GLUCAP in the last 168 hours.     Studies: Dg Chest 2 View  Result Date: 11/24/2016 CLINICAL DATA:  Acute onset of generalized chest pain, nausea, vomiting and body aches. Initial encounter. EXAM: CHEST  2 VIEW COMPARISON:  Chest radiograph performed 11/26/2013 FINDINGS: The lungs are well-aerated and clear. There is no evidence of focal opacification, pleural effusion or pneumothorax. The heart is normal in size; the mediastinal contour is within normal limits. No acute osseous abnormalities are seen. Bowel loops are noted interposed superior to the liver. IMPRESSION: No acute cardiopulmonary process seen. Electronically Signed   By: Roanna Raider M.D.   On: 11/24/2016 23:42   Ct Abdomen Pelvis W Contrast  Result Date: 11/25/2016 CLINICAL DATA:  Epigastric pain with vomiting EXAM: CT ABDOMEN AND PELVIS WITH CONTRAST TECHNIQUE: Multidetector CT imaging of the abdomen and pelvis was performed using the standard protocol following bolus administration of intravenous contrast. CONTRAST:  80mL ISOVUE-300 IOPAMIDOL (ISOVUE-300) INJECTION 61% COMPARISON:  None. FINDINGS: Lower chest: Lung bases demonstrate no acute consolidation or effusion. Patchy dependent atelectasis. Normal heart size. Hepatobiliary: No focal hepatic abnormality. No calcified gallstones or biliary dilatation. Pancreas: Unremarkable. No pancreatic ductal dilatation or surrounding inflammatory changes. Spleen: Normal in size without focal abnormality. Adrenals/Urinary Tract: Adrenal glands are unremarkable. Kidneys are normal, without renal calculi, focal lesion, or hydronephrosis. Bladder is  unremarkable. Stomach/Bowel: The stomach is moderately enlarged. There are multiple fluid-filled loops of borderline to slightly enlarged small bowel, measuring up to 3 cm. Possible transition point in the right upper quadrant anterior to the liver. Decompressed distal small bowel and colon. Appendix non identified. No colon wall thickening. Sigmoid colon diverticular disease. Vascular/Lymphatic: Aortic atherosclerosis. No enlarged abdominal or pelvic lymph nodes. IVC filter in the infrarenal IVC. Reproductive: Uterus and bilateral adnexa are unremarkable. Other: Small free fluid in the pelvis. No free air. Fat in the umbilicus. Musculoskeletal: No acute or suspicious bone lesion. IMPRESSION: 1. There are multiple borderline to slightly enlarged fluid-filled loops of small bowel with collapsed distal small bowel in the right upper quadrant, findings are concerning for a developing bowel obstruction. 2. Sigmoid colon diverticular disease without acute inflammation. 3. Small amount of free fluid in the pelvis Electronically Signed   By: Jasmine Pang M.D.   On: 11/25/2016 03:47   Dg Abd 2 Views  Result Date: 11/25/2016 CLINICAL DATA:  Small bowel obstruction. EXAM: ABDOMEN - 2 VIEW COMPARISON:  CT of the abdomen and pelvis on 11/25/2016 FINDINGS: Beneath the right hemidiaphragm, there is an air-fluid collection which may be within a loop of bowel based on prior CT exam. However is difficult to exclude free intraperitoneal air in the setting of obstruction. There are dilated loops of small bowel within the central abdomen, associated with air-fluid levels on the erect view. The colon is not dilated. There is residual contrast in colon. Contrast is also identified within the bladder following recent CT exam. IVC filter overlies L3. IMPRESSION: 1. Persistent small bowel obstruction. 2. Cannot entirely exclude free intraperitoneal air. 3. A left lateral decubitus view of the abdomen is recommended for further  evaluation. These results will be called to the ordering clinician or representative by the Radiologist Assistant, and communication documented in the PACS or zVision Dashboard. Electronically Signed   By: Norva Pavlov M.D.   On: 11/25/2016 14:18   Dg Abd Portable 1v-small Bowel Protocol-position Verification  Result Date: 11/25/2016 CLINICAL DATA:  61 year old female status post NG tube placement. EXAM: PORTABLE ABDOMEN - 1 VIEW COMPARISON:  Radiograph dated 11/25/2016 FINDINGS: There has been interval placement of an enteric tube with sideport in the proximal stomach and tip in the region of the body of the stomach. Multiple dilated air-filled loops of small bowel again noted measuring up to 4.1 cm in the mid abdomen. No definite free air identified. An IVC filter is again seen. Partially visualized distended urinary bladder containing excreted intravenous contrast. IMPRESSION: Interval placement of an enteric tube with tip in the gastric body. Persistent dilatation of small-bowel loops. Electronically Signed   By: Elgie Collard M.D.   On: 11/25/2016 23:29      No results found for: HGBA1C Lab Results  Component Value Date   CREATININE 1.17 (H) 11/26/2016       Scheduled Meds: . enoxaparin (LOVENOX) injection  40 mg Subcutaneous Q24H  . levothyroxine  25 mcg Intravenous Daily  . phenytoin (DILANTIN) IV  100 mg Intravenous 2 times per day  . sodium chloride flush  3 mL Intravenous Q12H   Continuous Infusions: . sodium chloride 100 mL/hr at 11/25/16 2022  . phenytoin (DILANTIN) IV Stopped (11/25/16 2247)     LOS: 1 day    Time spent: >30 MINS    Richarda OverlieNayana Mohamed [place]lock  [facility] Pager [phone]. If 7PM-7AM, please contact night-coverage at [email or web address], password [facility] [email or web address] 11/26/2016, 8:50 AM  LOS: 1 day

## 2016-11-27 ENCOUNTER — Inpatient Hospital Stay (HOSPITAL_COMMUNITY): Payer: Medicaid Other

## 2016-11-27 DIAGNOSIS — E032 Hypothyroidism due to medicaments and other exogenous substances: Secondary | ICD-10-CM

## 2016-11-27 DIAGNOSIS — R569 Unspecified convulsions: Secondary | ICD-10-CM

## 2016-11-27 LAB — COMPREHENSIVE METABOLIC PANEL
ALBUMIN: 2.7 g/dL — AB (ref 3.5–5.0)
ALT: 50 U/L (ref 14–54)
AST: 50 U/L — AB (ref 15–41)
Alkaline Phosphatase: 67 U/L (ref 38–126)
Anion gap: 7 (ref 5–15)
BILIRUBIN TOTAL: 0.5 mg/dL (ref 0.3–1.2)
BUN: 11 mg/dL (ref 6–20)
CHLORIDE: 112 mmol/L — AB (ref 101–111)
CO2: 23 mmol/L (ref 22–32)
Calcium: 8.1 mg/dL — ABNORMAL LOW (ref 8.9–10.3)
Creatinine, Ser: 0.96 mg/dL (ref 0.44–1.00)
GFR calc Af Amer: >60 mL/min (ref >60)
GFR calc non Af Amer: >60 mL/min (ref >60)
GLUCOSE: 84 mg/dL (ref 65–99)
POTASSIUM: 3.9 mmol/L (ref 3.5–5.1)
Sodium: 142 mmol/L (ref 135–145)
TOTAL PROTEIN: 5.1 g/dL — AB (ref 6.5–8.1)

## 2016-11-27 LAB — CBC
HEMATOCRIT: 30.5 % — AB (ref 36.0–46.0)
Hemoglobin: 10 g/dL — ABNORMAL LOW (ref 12.0–15.0)
MCH: 32.7 pg (ref 26.0–34.0)
MCHC: 32.8 g/dL (ref 30.0–36.0)
MCV: 99.7 fL (ref 78.0–100.0)
Platelets: 126 10*3/uL — ABNORMAL LOW (ref 150–400)
RBC: 3.06 MIL/uL — ABNORMAL LOW (ref 3.87–5.11)
RDW: 12.4 % (ref 11.5–15.5)
WBC: 4.3 10*3/uL (ref 4.0–10.5)

## 2016-11-27 NOTE — Progress Notes (Signed)
[facility] PROGRESS NOTE  Karla [name] ZOX:[id number] DOB: [birth year] DOA: 11/25/2016   PCP: Patient, No Pcp Per     Assessment/Plan: Principal Problem:   SBO (small bowel obstruction) (HCC) Active Problems:   Seizures (HCC)   Hypothyroidism   CKD (chronic kidney disease), stage III   Encounter for imaging study to confirm nasogastric (NG) tube placement   61 y.o. Female with a PMH endocarditis, cardiac arrest, CKD III, hypothyroidism, and seizure, who presented to [facility] on 11/25/16 with a cc of abdominal pain associated with nausea and vomiting. Pain started less than 24 hours prior to presentation to the hospital. CT ABD/PELV W/: fluid filled loops of small bowel in RUQ with borderline dilation (3 cm) and decompressed distal small bowel. Diverticulosis present. Patient admitted for small bowel obstruction. Surgery consulted  Assessment and plan  SBO (small bowel obstruction) (HCC): as shown by CT scan. Patient has mild leukocytosis, but no fever. Clinically nonseptic. Hemodynamically stable.  General surgery following Patient had NG tube placed 6/17.  Serial KUB shows improvement -NG tube clamped and removed. Patient had 1000 mL output in the last 24 hours Tolerating clear liquids, BM. 1, advance to soft diet May be able to remove NG tube   -Minimize narcotics -Prn Zofran prn nausea      Seizures (HCC): Seizure precaution Continue Dilantin   IV, may transition to oral once NG tube is discontinued Dilantin level 5.6  Hypothyroidism:   TSH 1.42, free T4 0.95 -swicth to oral synthroid  once NG tube is discontinued  CKD (chronic kidney disease), stage III:  Close to baseline. Baseline creatinine 1.13 on 11/26/13. Her creatinine is 0.96   Follow-up renal function intermittently   DVT prophylaxsis Lovenox  Code Status:  Full code    Family Communication: Discussed in detail with the patient, all imaging results, lab results explained to the  patient   Disposition Plan:  Pending clinical improvement, anticipate discharge 6/20      Consultants:  General surgery  Procedures:  None  Antibiotics: Anti-infectives    None         HPI/Subjective: Patient had a BM yesterday, denies nausea  Objective: Vitals:   11/26/16 1323 11/26/16 2143 11/26/16 2147 11/27/16 0609  BP: 103/65 (!) 137/120 119/65 125/65  Pulse: 63 64 63 (!) 59  Resp: 18 16  18   Temp: 98.1 F (36.7 C) 97.9 F (36.6 C)  98.5 F (36.9 C)  TempSrc:      SpO2: 96% 98% (!) 73% 96%  Weight:      Height:        Intake/Output Summary (Last 24 hours) at 11/27/16 0811 Last data filed at 11/27/16 0603  Gross per 24 hour  Intake             2409 ml  Output              250 ml  Net             2159 ml    Exam:  Examination:  General exam: NG tube in place Respiratory system: Clear to auscultation. Respiratory effort normal. Cardiovascular system: S1 & S2 heard, RRR. No JVD, murmurs, rubs, gallops or clicks. No pedal edema. Gastrointestinal system: Decreased bowel sounds , Abdomen nontender. No organomegaly or masses felt. Central nervous system: Alert and oriented. No focal neurological deficits. Extremities: Symmetric 5 x 5 power. Skin: No rashes, lesions or ulcers Psychiatry: Judgement and insight appear normal. Mood & affect  appropriate.     Data Reviewed: I have personally reviewed following labs and imaging studies  Micro Results No results found for this or any previous visit (from the past 240 hour(s)).  Radiology Reports Dg Chest 2 View  Result Date: 11/24/2016 CLINICAL DATA:  Acute onset of generalized chest pain, nausea, vomiting and body aches. Initial encounter. EXAM: CHEST  2 VIEW COMPARISON:  Chest radiograph performed 11/26/2013 FINDINGS: The lungs are well-aerated and clear. There is no evidence of focal opacification, pleural effusion or pneumothorax. The heart is normal in size; the mediastinal contour is within normal  limits. No acute osseous abnormalities are seen. Bowel loops are noted interposed superior to the liver. IMPRESSION: No acute cardiopulmonary process seen. Electronically Signed   By: Roanna Raider M.D.   On: 11/24/2016 23:42   Ct Abdomen Pelvis W Contrast  Result Date: 11/25/2016 CLINICAL DATA:  Epigastric pain with vomiting EXAM: CT ABDOMEN AND PELVIS WITH CONTRAST TECHNIQUE: Multidetector CT imaging of the abdomen and pelvis was performed using the standard protocol following bolus administration of intravenous contrast. CONTRAST:  80mL ISOVUE-300 IOPAMIDOL (ISOVUE-300) INJECTION 61% COMPARISON:  None. FINDINGS: Lower chest: Lung bases demonstrate no acute consolidation or effusion. Patchy dependent atelectasis. Normal heart size. Hepatobiliary: No focal hepatic abnormality. No calcified gallstones or biliary dilatation. Pancreas: Unremarkable. No pancreatic ductal dilatation or surrounding inflammatory changes. Spleen: Normal in size without focal abnormality. Adrenals/Urinary Tract: Adrenal glands are unremarkable. Kidneys are normal, without renal calculi, focal lesion, or hydronephrosis. Bladder is unremarkable. Stomach/Bowel: The stomach is moderately enlarged. There are multiple fluid-filled loops of borderline to slightly enlarged small bowel, measuring up to 3 cm. Possible transition point in the right upper quadrant anterior to the liver. Decompressed distal small bowel and colon. Appendix non identified. No colon wall thickening. Sigmoid colon diverticular disease. Vascular/Lymphatic: Aortic atherosclerosis. No enlarged abdominal or pelvic lymph nodes. IVC filter in the infrarenal IVC. Reproductive: Uterus and bilateral adnexa are unremarkable. Other: Small free fluid in the pelvis. No free air. Fat in the umbilicus. Musculoskeletal: No acute or suspicious bone lesion. IMPRESSION: 1. There are multiple borderline to slightly enlarged fluid-filled loops of small bowel with collapsed distal small  bowel in the right upper quadrant, findings are concerning for a developing bowel obstruction. 2. Sigmoid colon diverticular disease without acute inflammation. 3. Small amount of free fluid in the pelvis Electronically Signed   By: Jasmine Pang M.D.   On: 11/25/2016 03:47   Dg Abd 2 Views  Result Date: 11/25/2016 CLINICAL DATA:  Small bowel obstruction. EXAM: ABDOMEN - 2 VIEW COMPARISON:  CT of the abdomen and pelvis on 11/25/2016 FINDINGS: Beneath the right hemidiaphragm, there is an air-fluid collection which may be within a loop of bowel based on prior CT exam. However is difficult to exclude free intraperitoneal air in the setting of obstruction. There are dilated loops of small bowel within the central abdomen, associated with air-fluid levels on the erect view. The colon is not dilated. There is residual contrast in colon. Contrast is also identified within the bladder following recent CT exam. IVC filter overlies L3. IMPRESSION: 1. Persistent small bowel obstruction. 2. Cannot entirely exclude free intraperitoneal air. 3. A left lateral decubitus view of the abdomen is recommended for further evaluation. These results will be called to the ordering clinician or representative by the Radiologist Assistant, and communication documented in the PACS or zVision Dashboard. Electronically Signed   By: Norva Pavlov M.D.   On: 11/25/2016 14:18   Dg Abd  Portable 1v  Result Date: 11/26/2016 CLINICAL DATA:  8 hour small-bowel followup EXAM: PORTABLE ABDOMEN - 1 VIEW COMPARISON:  11/25/2016 FINDINGS: Nasogastric catheter remains in the stomach. Previously administered contrast material lies predominately within the colon although some remains in the small bowel. Mild persistent small bowel dilatation is seen although overall improved from the previous exam. An IVC filter is again noted in place. IMPRESSION: Previously administered contrast now lies within the colon. Mild improvement in the degree of small  bowel dilatation. Electronically Signed   By: Alcide Clever M.D.   On: 11/26/2016 09:41   Dg Abd Portable 1v-small Bowel Protocol-position Verification  Result Date: 11/25/2016 CLINICAL DATA:  61 year old female status post NG tube placement. EXAM: PORTABLE ABDOMEN - 1 VIEW COMPARISON:  Radiograph dated 11/25/2016 FINDINGS: There has been interval placement of an enteric tube with sideport in the proximal stomach and tip in the region of the body of the stomach. Multiple dilated air-filled loops of small bowel again noted measuring up to 4.1 cm in the mid abdomen. No definite free air identified. An IVC filter is again seen. Partially visualized distended urinary bladder containing excreted intravenous contrast. IMPRESSION: Interval placement of an enteric tube with tip in the gastric body. Persistent dilatation of small-bowel loops. Electronically Signed   By: Elgie Collard M.D.   On: 11/25/2016 23:29     CBC  Recent Labs Lab 11/24/16 2226 11/25/16 0458 11/26/16 0603 11/27/16 0611  WBC 11.8* 9.9 7.2 4.3  HGB 11.7* 12.9 10.6* 10.0*  HCT 35.1* 38.1 32.8* 30.5*  PLT 182 155 144* 126*  MCV 97.2 99.0 100.0 99.7  MCH 32.4 33.5 32.3 32.7  MCHC 33.3 33.9 32.3 32.8  RDW 12.2 12.5 12.5 12.4    Chemistries   Recent Labs Lab 11/24/16 2226 11/25/16 0017 11/25/16 0458 11/26/16 0603 11/26/16 0905 11/27/16 0611  NA 134*  --  138 141  --  142  K 3.7  --  4.5 4.0  --  3.9  CL 97*  --  99* 108  --  112*  CO2 25  --  29 26  --  23  GLUCOSE 160*  --  128* 93  --  84  BUN 11  --  13 16  --  11  CREATININE 1.23*  --  1.23* 1.17*  --  0.96  CALCIUM 9.6  --  9.5 8.3*  --  8.1*  MG  --   --   --   --  2.0  --   AST  --  25  --   --   --  50*  ALT  --  17  --   --   --  50  ALKPHOS  --  106  --   --   --  67  BILITOT  --  0.6  --   --   --  0.5   ------------------------------------------------------------------------------------------------------------------ estimated creatinine clearance  is 60.3 mL/min (by C-G formula based on SCr of 0.96 mg/dL). ------------------------------------------------------------------------------------------------------------------ No results for input(s): HGBA1C in the last 72 hours. ------------------------------------------------------------------------------------------------------------------ No results for input(s): CHOL, HDL, LDLCALC, TRIG, CHOLHDL, LDLDIRECT in the last 72 hours. ------------------------------------------------------------------------------------------------------------------  Recent Labs  11/26/16 0905  TSH 1.420   ------------------------------------------------------------------------------------------------------------------ No results for input(s): VITAMINB12, FOLATE, FERRITIN, TIBC, IRON, RETICCTPCT in the last 72 hours.  Coagulation profile  Recent Labs Lab 11/25/16 0458  INR 1.09    No results for input(s): DDIMER in the last 72 hours.  Cardiac Enzymes  Recent Labs  Lab 11/25/16 0458 11/25/16 0840 11/25/16 1616  TROPONINI <0.03 <0.03 <0.03   ------------------------------------------------------------------------------------------------------------------ Invalid input(s): POCBNP   CBG: No results for input(s): GLUCAP in the last 168 hours.     Studies: Dg Abd 2 Views  Result Date: 11/25/2016 CLINICAL DATA:  Small bowel obstruction. EXAM: ABDOMEN - 2 VIEW COMPARISON:  CT of the abdomen and pelvis on 11/25/2016 FINDINGS: Beneath the right hemidiaphragm, there is an air-fluid collection which may be within a loop of bowel based on prior CT exam. However is difficult to exclude free intraperitoneal air in the setting of obstruction. There are dilated loops of small bowel within the central abdomen, associated with air-fluid levels on the erect view. The colon is not dilated. There is residual contrast in colon. Contrast is also identified within the bladder following recent CT exam. IVC filter  overlies L3. IMPRESSION: 1. Persistent small bowel obstruction. 2. Cannot entirely exclude free intraperitoneal air. 3. A left lateral decubitus view of the abdomen is recommended for further evaluation. These results will be called to the ordering clinician or representative by the Radiologist Assistant, and communication documented in the PACS or zVision Dashboard. Electronically Signed   By: Norva PavlovElizabeth  Brown M.D.   On: 11/25/2016 14:18   Dg Abd Portable 1v  Result Date: 11/26/2016 CLINICAL DATA:  8 hour small-bowel followup EXAM: PORTABLE ABDOMEN - 1 VIEW COMPARISON:  11/25/2016 FINDINGS: Nasogastric catheter remains in the stomach. Previously administered contrast material lies predominately within the colon although some remains in the small bowel. Mild persistent small bowel dilatation is seen although overall improved from the previous exam. An IVC filter is again noted in place. IMPRESSION: Previously administered contrast now lies within the colon. Mild improvement in the degree of small bowel dilatation. Electronically Signed   By: Alcide CleverMark  Lukens M.D.   On: 11/26/2016 09:41   Dg Abd Portable 1v-small Bowel Protocol-position Verification  Result Date: 11/25/2016 CLINICAL DATA:  61 year old female status post NG tube placement. EXAM: PORTABLE ABDOMEN - 1 VIEW COMPARISON:  Radiograph dated 11/25/2016 FINDINGS: There has been interval placement of an enteric tube with sideport in the proximal stomach and tip in the region of the body of the stomach. Multiple dilated air-filled loops of small bowel again noted measuring up to 4.1 cm in the mid abdomen. No definite free air identified. An IVC filter is again seen. Partially visualized distended urinary bladder containing excreted intravenous contrast. IMPRESSION: Interval placement of an enteric tube with tip in the gastric body. Persistent dilatation of small-bowel loops. Electronically Signed   By: Elgie CollardArash  Radparvar M.D.   On: 11/25/2016 23:29      No  results found for: HGBA1C Lab Results  Component Value Date   CREATININE 0.96 11/27/2016       Scheduled Meds: . enoxaparin (LOVENOX) injection  40 mg Subcutaneous Q24H  . levothyroxine  25 mcg Intravenous Daily  . phenytoin (DILANTIN) IV  100 mg Intravenous 2 times per day  . sodium chloride flush  3 mL Intravenous Q12H   Continuous Infusions: . sodium chloride 100 mL/hr (11/27/16 0101)  . phenytoin (DILANTIN) IV Stopped (11/26/16 2219)     LOS: 2 days    Time spent: >30 MINS    Richarda OverlieNayana Jensine Luz  [facility] Pager [phone]. If 7PM-7AM, please contact night-coverage at [email or web address], password [email or web address] 11/27/2016, 8:11 AM  LOS: 2 days

## 2016-11-27 NOTE — Progress Notes (Signed)
[facility]/Trauma Progress Note      Subjective: CC: SBO  Pt is not having abdominal pain. She is having flatus and had a BM yesterday. Tolerated the NGT clamping and clears. No fevers.   Objective: Vital signs in last 24 hours: Temp:  [97.9 F (36.6 C)-98.5 F (36.9 C)] 98.5 F (36.9 C) (06/19 0609) Pulse Rate:  [59-64] 59 (06/19 0609) Resp:  [16-18] 18 (06/19 0609) BP: (103-137)/(65-120) 125/65 (06/19 0609) SpO2:  [73 %-98 %] 96 % (06/19 0609) Last BM Date: 11/26/16  Intake/Output from previous day: 06/18 0701 - 06/19 0700 In: 2409 [I.V.:2305; IV Piggyback:104] Out: 1000 [Emesis/NG output:1000] Intake/Output this shift: No intake/output data recorded.  PE: Gen:  Alert, NAD, pleasant, cooperative, well appearing Pulm:  Rate and effort normal Abd: Soft, NT/ND, +BS, small non tender umbilical hernia Skin: no rashes noted, warm and dry  Lab Results:   Recent Labs  11/26/16 0603 11/27/16 0611  WBC 7.2 4.3  HGB 10.6* 10.0*  HCT 32.8* 30.5*  PLT 144* 126*   BMET  Recent Labs  11/26/16 0603 11/27/16 0611  NA 141 142  K 4.0 3.9  CL 108 112*  CO2 26 23  GLUCOSE 93 84  BUN 16 11  CREATININE 1.17* 0.96  CALCIUM 8.3* 8.1*   PT/INR  Recent Labs  11/25/16 0458  LABPROT 14.2  INR 1.09   CMP     Component Value Date/Time   NA 142 11/27/2016 0611   K 3.9 11/27/2016 0611   CL 112 (H) 11/27/2016 0611   CO2 23 11/27/2016 0611   GLUCOSE 84 11/27/2016 0611   BUN 11 11/27/2016 0611   CREATININE 0.96 11/27/2016 0611   CALCIUM 8.1 (L) 11/27/2016 0611   PROT 5.1 (L) 11/27/2016 0611   ALBUMIN 2.7 (L) 11/27/2016 0611   AST 50 (H) 11/27/2016 0611   ALT 50 11/27/2016 0611   ALKPHOS 67 11/27/2016 0611   BILITOT 0.5 11/27/2016 0611   GFRNONAA >60 11/27/2016 0611   GFRAA >60 11/27/2016 0611   Lipase     Component Value Date/Time   LIPASE 35 11/25/2016 0017    Studies/Results: Dg Abd 2 Views  Result Date: 11/25/2016 CLINICAL DATA:  Small  bowel obstruction. EXAM: ABDOMEN - 2 VIEW COMPARISON:  CT of the abdomen and pelvis on 11/25/2016 FINDINGS: Beneath the right hemidiaphragm, there is an air-fluid collection which may be within a loop of bowel based on prior CT exam. However is difficult to exclude free intraperitoneal air in the setting of obstruction. There are dilated loops of small bowel within the central abdomen, associated with air-fluid levels on the erect view. The colon is not dilated. There is residual contrast in colon. Contrast is also identified within the bladder following recent CT exam. IVC filter overlies L3. IMPRESSION: 1. Persistent small bowel obstruction. 2. Cannot entirely exclude free intraperitoneal air. 3. A left lateral decubitus view of the abdomen is recommended for further evaluation. These results will be called to the ordering clinician or representative by the Radiologist Assistant, and communication documented in the PACS or zVision Dashboard. Electronically Signed   By: Norva Pavlov M.D.   On: 11/25/2016 14:18   Dg Abd Portable 1v  Result Date: 11/26/2016 CLINICAL DATA:  8 hour small-bowel followup EXAM: PORTABLE ABDOMEN - 1 VIEW COMPARISON:  11/25/2016 FINDINGS: Nasogastric catheter remains in the stomach. Previously administered contrast material lies predominately within the colon although some remains in the small bowel. Mild persistent small bowel dilatation is seen although  overall improved from the previous exam. An IVC filter is again noted in place. IMPRESSION: Previously administered contrast now lies within the colon. Mild improvement in the degree of small bowel dilatation. Electronically Signed   By: Alcide CleverMark  Lukens M.D.   On: 11/26/2016 09:41   Dg Abd Portable 1v-small Bowel Protocol-position Verification  Result Date: 11/25/2016 CLINICAL DATA:  61 year old female status post NG tube placement. EXAM: PORTABLE ABDOMEN - 1 VIEW COMPARISON:  Radiograph dated 11/25/2016 FINDINGS: There has been  interval placement of an enteric tube with sideport in the proximal stomach and tip in the region of the body of the stomach. Multiple dilated air-filled loops of small bowel again noted measuring up to 4.1 cm in the mid abdomen. No definite free air identified. An IVC filter is again seen. Partially visualized distended urinary bladder containing excreted intravenous contrast. IMPRESSION: Interval placement of an enteric tube with tip in the gastric body. Persistent dilatation of small-bowel loops. Electronically Signed   By: Elgie CollardArash  Radparvar M.D.   On: 11/25/2016 23:29    Anti-infectives: Anti-infectives    None       Assessment/Plan CKD-III Hypothyroidism H/o seizures  SBO  - suspect 2/2 intra-abdominal adhesions from previous surgeries (c section, tubal ligation, appendectomy) - CT scan with mild air/fluid levels in RUQ but not significant for high grade SBO. - tolerated NGT clamping and clears - pt is having flatus and BM  ID - none FEN - fulls VTE - SCDs, lovenox  Plan - pull NGT, full liquids. If pt tolerates fulls then can advance to soft diet later today.    LOS: 2 days    Jerre SimonJessica L Abiola Behring , [facility] 11/27/2016, 8:14 AM Pager: [phone] Consults: [phone] Mon-Fri 7:00 am-4:30 pm Sat-Sun 7:00 am-11:30 am

## 2016-11-27 NOTE — Evaluation (Signed)
Physical Therapy Evaluation Patient Details Name: Karla [name] MRN: [id number] DOB: May 27, 1956 Today's Date: 11/27/2016   History of Present Illness  Pt is a 61 y/o female admitted secondary to chest pain and emesis. Imaging revealed a small bowel obstruction and diverticular disease in the sigmoid colon. PMH includes Lupus, cardiac arrest, seizures, hypothyroidism, and patellar fx surgery.   Clinical Impression  Pt admitted secondary to problem above with deficits below. PTA, pt was independent with functional mobility with use of a cane for outdoor ambulation. Upon evaluation, pt unsteady without use of AD and required min A for steadying. With use of RW increased stability and requiring min guard for safety. Educated about use of RW at home, and pt agreeable. Recommending RW for home use and no PT follow up. Will continue to follow to maximize safety and independence with functional mobility.      Follow Up Recommendations No PT follow up;Supervision/Assistance - 24 hour    Equipment Recommendations  Rolling walker with 5" wheels    Recommendations for Other Services       Precautions / Restrictions Precautions Precautions: None Restrictions Weight Bearing Restrictions: No      Mobility  Bed Mobility Overal bed mobility: Modified Independent             General bed mobility comments: Extended time required however, no external assist.   Transfers Overall transfer level: Needs assistance Equipment used: None Transfers: Sit to/from Stand Sit to Stand: Min guard         General transfer comment: Min guard for safety.   Ambulation/Gait Ambulation/Gait assistance: Min assist;Min guard Ambulation Distance (Feet): 150 Feet Assistive device: Rolling walker (2 wheeled);None Gait Pattern/deviations: Step-through pattern;Decreased stride length;Trunk flexed Gait velocity: Decreased Gait velocity interpretation: Below normal speed for age/gender General Gait  Details: Attempted gait without AD, however, pt unsteady and requiring min A. Used RW for remainder of gait training and pt requiring min guard for safety. Educated about use at home to increase stability. Pt agreeable.   Stairs            Wheelchair Mobility    Modified Rankin (Stroke Patients Only)       Balance Overall balance assessment: Needs assistance Sitting-balance support: No upper extremity supported;Feet supported Sitting balance-Leahy Scale: Good     Standing balance support: No upper extremity supported;Bilateral upper extremity supported;During functional activity Standing balance-Leahy Scale: Fair Standing balance comment: able to maintain static standing without UE support                             Pertinent Vitals/Pain Pain Assessment: No/denies pain    Home Living Family/patient expects to be discharged to:: Private residence Living Arrangements: Spouse/significant other;Other (Comment) (grandson ) Available Help at Discharge: Family;Available 24 hours/day Type of Home: Mobile home Home Access: Stairs to enter Entrance Stairs-Rails: Right;Left;Can reach both Entrance Stairs-Number of Steps: 8 Home Layout: Other (Comment) (steps to get into the kitchen ) Home Equipment: Cane - single point      Prior Function Level of Independence: Independent with assistive device(s)         Comments: Uses cane outside, but occaisionally goes without     Hand Dominance   Dominant Hand: Left    Extremity/Trunk Assessment   Upper Extremity Assessment Upper Extremity Assessment: Defer to OT evaluation    Lower Extremity Assessment Lower Extremity Assessment: Generalized weakness (Grossly 4/5 throughout )    Cervical / Trunk Assessment  Cervical / Trunk Assessment: Normal  Communication   Communication: No difficulties  Cognition Arousal/Alertness: Awake/alert Behavior During Therapy: WFL for tasks assessed/performed Overall Cognitive  Status: Within Functional Limits for tasks assessed                                        General Comments General comments (skin integrity, edema, etc.): Educated pt about use of RW at home, and pt agreeable.     Exercises     Assessment/Plan    PT Assessment Patient needs continued PT services  PT Problem List Decreased strength;Decreased balance;Decreased mobility;Decreased knowledge of use of DME;Decreased knowledge of precautions       PT Treatment Interventions DME instruction;Gait training;Stair training;Functional mobility training;Therapeutic activities;Therapeutic exercise;Balance training;Neuromuscular re-education;Patient/family education    PT Goals (Current goals can be found in the Care Plan section)  Acute Rehab PT Goals Patient Stated Goal: to go home  PT Goal Formulation: With patient Time For Goal Achievement: 12/04/16 Potential to Achieve Goals: Good    Frequency Min 3X/week   Barriers to discharge        Co-evaluation               AM-PAC PT "6 Clicks" Daily Activity  Outcome Measure Difficulty turning over in bed (including adjusting bedclothes, sheets and blankets)?: A Little Difficulty moving from lying on back to sitting on the side of the bed? : A Little Difficulty sitting down on and standing up from a chair with arms (e.g., wheelchair, bedside commode, etc,.)?: Total Help needed moving to and from a bed to chair (including a wheelchair)?: A Little Help needed walking in hospital room?: A Little Help needed climbing 3-5 steps with a railing? : A Little 6 Click Score: 16    End of Session Equipment Utilized During Treatment: Gait belt Activity Tolerance: Patient tolerated treatment well Patient left: in bed;with call bell/phone within reach Nurse Communication: Mobility status PT Visit Diagnosis: Unsteadiness on feet (R26.81)    Time: [id number] PT Time Calculation (min) (ACUTE ONLY): 16 min   Charges:   PT  Evaluation $PT Eval Low Complexity: 1 Procedure PT Treatments $Gait Training: 8-22 mins   PT G Codes:        Gladys DammeBrittany Martese Vanatta, PT, DPT  Acute Rehabilitation Services  Pager: [phone]   Lehman PromBrittany S Thomas Mabry 11/27/2016, 4:40 PM

## 2016-11-28 MED ORDER — POLYETHYLENE GLYCOL 3350 17 G PO PACK
17.0000 g | PACK | Freq: Two times a day (BID) | ORAL | Status: DC
  Administered 2016-11-28: 17 g via ORAL
  Filled 2016-11-28: qty 1

## 2016-11-28 MED ORDER — POLYETHYLENE GLYCOL 3350 17 G PO PACK: 17.0000 g | PACK | Freq: Two times a day (BID) | ORAL | 0 refills | Status: DC

## 2016-11-28 NOTE — Evaluation (Signed)
Occupational Therapy Evaluation and Discharge Patient Details Name: Karla [name] MRN: [id number] DOB: 03/26/1956 Today's Date: 11/28/2016    History of Present Illness Pt is a 61 y/o female admitted secondary to chest pain and emesis. Imaging revealed a small bowel obstruction and diverticular disease in the sigmoid colon. PMH includes Lupus, cardiac arrest, seizures, hypothyroidism, and patellar fx surgery.    Clinical Impression   Pt is functioning at a supervision level in standing ADL and ADL transfer for safety and line management. Pt has her husband available to assist with shower transfers and IADL until she is able to resume. No further OT needs.    Follow Up Recommendations  No OT follow up    Equipment Recommendations  None recommended by OT    Recommendations for Other Services       Precautions / Restrictions Precautions Precautions: None Restrictions Weight Bearing Restrictions: No      Mobility Bed Mobility Overal bed mobility: Modified Independent                Transfers Overall transfer level: Needs assistance Equipment used: Rolling walker (2 wheeled) Transfers: Sit to/from Stand Sit to Stand: Supervision         General transfer comment: supervision for safety, pt slightly weak    Balance Overall balance assessment: Needs assistance   Sitting balance-Leahy Scale: Good       Standing balance-Leahy Scale: Fair                             ADL either performed or assessed with clinical judgement   ADL Overall ADL's : Needs assistance/impaired Eating/Feeding: Independent;Bed level   Grooming: Supervision/safety;Standing   Upper Body Bathing: Supervision/ safety;Standing   Lower Body Bathing: Supervison/ safety   Upper Body Dressing : Set up;Sitting   Lower Body Dressing: Sit to/from stand;Set up   Toilet Transfer: Ambulation;RW;BSC;Supervision/safety (over toilet)   Toileting- Clothing Manipulation and  Hygiene: Supervision/safety;Sit to/from Nurse, children's Details (indicate cue type and reason): recommended pt use shower seat and have supervision for showering initially Functional mobility during ADLs: Supervision/safety;Rolling walker       Vision Baseline Vision/History: Wears glasses Wears Glasses: At all times Patient Visual Report: No change from baseline       Perception     Praxis      Pertinent Vitals/Pain Pain Assessment: Faces Faces Pain Scale: Hurts little more Pain Location: IV site, R UE Pain Descriptors / Indicators: Grimacing Pain Intervention(s): Other (comment) (RN made aware of pain and leaking IV site)     Hand Dominance Left   Extremity/Trunk Assessment Upper Extremity Assessment Upper Extremity Assessment: Overall WFL for tasks assessed   Lower Extremity Assessment Lower Extremity Assessment: Defer to PT evaluation   Cervical / Trunk Assessment Cervical / Trunk Assessment: Normal   Communication Communication Communication: No difficulties   Cognition Arousal/Alertness: Awake/alert Behavior During Therapy: WFL for tasks assessed/performed Overall Cognitive Status: Within Functional Limits for tasks assessed                                     General Comments       Exercises     Shoulder Instructions      Home Living Family/patient expects to be discharged to:: Private residence Living Arrangements: Spouse/significant other;Other (Comment) (69 year old grandson) Available Help at Discharge: Family;Available  24 hours/day Type of Home: Mobile home Home Access: Stairs to enter Entrance Stairs-Number of Steps: 8 Entrance Stairs-Rails: Right;Left;Can reach both Home Layout: Other (Comment) (steps down to the kitchen) Alternate Level Stairs-Number of Steps: 3 Alternate Level Stairs-Rails: Right Bathroom Shower/Tub: Tub/shower unit   Bathroom Toilet: Handicapped height     Home Equipment: Cane - single  point;Shower seat          Prior Functioning/Environment Level of Independence: Independent with assistive device(s)        Comments: uses cane outside, has a shower seat, but does not use        OT Problem List: Impaired balance (sitting and/or standing);Decreased activity tolerance      OT Treatment/Interventions:      OT Goals(Current goals can be found in the care plan section) Acute Rehab OT Goals Patient Stated Goal: to go home   OT Frequency:     Barriers to D/C:            Co-evaluation              AM-PAC PT "6 Clicks" Daily Activity     Outcome Measure Help from another person eating meals?: None Help from another person taking care of personal grooming?: A Little Help from another person toileting, which includes using toliet, bedpan, or urinal?: A Little Help from another person bathing (including washing, rinsing, drying)?: A Little Help from another person to put on and taking off regular upper body clothing?: None Help from another person to put on and taking off regular lower body clothing?: A Little 6 Click Score: 20   End of Session Equipment Utilized During Treatment: Rolling walker  Activity Tolerance: Patient tolerated treatment well Patient left: in chair;with call bell/phone within reach;with nursing/sitter in room  OT Visit Diagnosis: Unsteadiness on feet (R26.81)                Time: 5366-4403: 0929-0944 OT Time Calculation (min): 15 min Charges:  OT General Charges $OT Visit: 1 Procedure OT Treatments $Self Care/Home Management : 8-22 mins G-Codes:      Evern BioMayberry, Pietrina Jagodzinski Lynn 11/28/2016, 9:53 AM  [phone]

## 2016-11-28 NOTE — Progress Notes (Signed)
Physical Therapy Treatment Patient Details Name: Karla [name] MRN: [id number] DOB: Jan 25, 1956 Today's Date: 11/28/2016    History of Present Illness Pt is a 61 y/o female admitted secondary to chest pain and emesis. Imaging revealed a small bowel obstruction and diverticular disease in the sigmoid colon. PMH includes Lupus, cardiac arrest, seizures, hypothyroidism, and patellar fx surgery.     PT Comments    Pt progressing well towards goals. Practiced stair management with pt requiring min guard assist for safety. Able to decrease assist to supervision during gait training with RW. Current recommendations appropriate. Educated pt about assist required at home for safety with stair management and use of RW to increase stability. Will continue to follow acutely.    Follow Up Recommendations  No PT follow up;Supervision/Assistance - 24 hour     Equipment Recommendations  Rolling walker with 5" wheels    Recommendations for Other Services       Precautions / Restrictions Precautions Precautions: None Restrictions Weight Bearing Restrictions: No    Mobility  Bed Mobility Overal bed mobility: Modified Independent             General bed mobility comments: Standing in room upon entry   Transfers Overall transfer level: Needs assistance Equipment used: Rolling walker (2 wheeled) Transfers: Sit to/from Stand Sit to Stand: Supervision         General transfer comment: Supervision for safety.   Ambulation/Gait Ambulation/Gait assistance: Supervision Ambulation Distance (Feet): 125 Feet Assistive device: Rolling walker (2 wheeled);None Gait Pattern/deviations: Step-through pattern;Decreased stride length Gait velocity: Decreased Gait velocity interpretation: Below normal speed for age/gender General Gait Details: Supervision for safety with RW. Improved posture and no LOB noted.    Stairs Stairs: Yes   Stair Management: One rail Right;Alternating pattern;Step  to pattern;Forwards Number of Stairs: 8 General stair comments: Pt using alternating pattern for ascending steps and step to pattern for descending the steps. Educated about use of step to technique for ascending to increase safety and stability. Educated about use of BUE on single rail for increased stability. Overall requiring min guard for safety. Educated to have assist when navigating stairs at home.   Wheelchair Mobility    Modified Rankin (Stroke Patients Only)       Balance Overall balance assessment: Needs assistance Sitting-balance support: No upper extremity supported;Feet supported Sitting balance-Leahy Scale: Good     Standing balance support: No upper extremity supported;Bilateral upper extremity supported;During functional activity Standing balance-Leahy Scale: Fair Standing balance comment: able to maintain static standing without UE support                            Cognition Arousal/Alertness: Awake/alert Behavior During Therapy: WFL for tasks assessed/performed Overall Cognitive Status: Within Functional Limits for tasks assessed                                        Exercises      General Comments General comments (skin integrity, edema, etc.): Pt to d/c today, so only agreeable to gait and stair training.       Pertinent Vitals/Pain Pain Assessment: No/denies pain Faces Pain Scale: Hurts little more Pain Location: IV site, R UE Pain Descriptors / Indicators: Grimacing Pain Intervention(s): Other (comment) (RN made aware of pain and leaking IV site)    Home Living Family/patient expects to be discharged to:: Private residence  Living Arrangements: Spouse/significant other;Other (Comment) (21 year old grandson) Available Help at Discharge: Family;Available 24 hours/day Type of Home: Mobile home Home Access: Stairs to enter Entrance Stairs-Rails: Right;Left;Can reach both Home Layout: Other (Comment) (steps down to the  kitchen) Home Equipment: Cane - single point;Shower seat      Prior Function Level of Independence: Independent with assistive device(s)      Comments: uses cane outside, has a shower seat, but does not use   PT Goals (current goals can now be found in the care plan section) Acute Rehab PT Goals Patient Stated Goal: to go home  PT Goal Formulation: With patient Time For Goal Achievement: 12/04/16 Potential to Achieve Goals: Good Progress towards PT goals: Progressing toward goals    Frequency    Min 3X/week      PT Plan Current plan remains appropriate    Co-evaluation              AM-PAC PT "6 Clicks" Daily Activity  Outcome Measure  Difficulty turning over in bed (including adjusting bedclothes, sheets and blankets)?: None Difficulty moving from lying on back to sitting on the side of the bed? : None Difficulty sitting down on and standing up from a chair with arms (e.g., wheelchair, bedside commode, etc,.)?: None Help needed moving to and from a bed to chair (including a wheelchair)?: None Help needed walking in hospital room?: None Help needed climbing 3-5 steps with a railing? : A Little 6 Click Score: 23    End of Session Equipment Utilized During Treatment: Gait belt Activity Tolerance: Patient tolerated treatment well Patient left: in chair;with call bell/phone within reach Nurse Communication: Mobility status PT Visit Diagnosis: Unsteadiness on feet (R26.81)     Time: 8119-1478 PT Time Calculation (min) (ACUTE ONLY): 10 min  Charges:  $Gait Training: 8-22 mins                    G Codes:       Gladys Damme, PT, DPT  Acute Rehabilitation Services  Pager: [phone]    Lehman Prom 11/28/2016, 11:45 AM

## 2016-11-28 NOTE — Progress Notes (Signed)
Karla IvanPatricia [name] discharged Home with husband & rolling walker per MD order.  Discharge instructions reviewed and discussed with the patient, all questions and concerns answered. Copy of instructions and care notes for new diagnosis and medications given to patient.  Prescriptions sent to [facility] on [address]   Allergies as of 11/28/2016   No Known Allergies     Medication List    TAKE these medications   aspirin EC 81 MG tablet Take 81 mg by mouth daily.   CALCIUM 600 + D PO Take 1 tablet by mouth daily.   levothyroxine 100 MCG tablet Commonly known as:  SYNTHROID, LEVOTHROID Take 50 mcg by mouth daily before breakfast.   multivitamin with minerals tablet Take 1 tablet by mouth daily.   phenytoin 100 MG ER capsule Commonly known as:  DILANTIN Take 100-200 mg by mouth See admin instructions. Take 1 capsule at 8 am, 1 pm and then take 2 capsules at beditme   polyethylene glycol packet Commonly known as:  MIRALAX / GLYCOLAX Take 17 g by mouth 2 (two) times daily.            Durable Medical Equipment        Start     Ordered   11/28/16 [id number]  For home use only DME Walker rolling  Once    Question:  Patient needs a walker to treat with the following condition  Answer:  SBO (small bowel obstruction) (HCC)   11/28/16 0821      Patients skin is clean, dry and intact, no evidence of skin break down. IV site discontinued and catheter remains intact. Site without signs and symptoms of complications. Dressing and pressure applied.  Patient escorted to car by nurse in a wheelchair,  no distress noted upon discharge.  Karla [name], Karla [name] 11/28/2016 3:21 PM

## 2016-11-28 NOTE — Discharge Summary (Signed)
Physician Discharge Summary  Karla [name] MRN: [id number] DOB/AGE: [birth year] 61 y.o.  PCP: Patient, No Pcp Per   Admit date: 11/25/2016 Discharge date: 11/28/2016  Discharge Diagnoses:    Principal Problem:   SBO (small bowel obstruction) ([place]) Active Problems:   Seizures ([place])   Hypothyroidism   CKD (chronic kidney disease), stage III   Encounter for imaging study to confirm nasogastric (NG) tube placement    Follow-up recommendations Follow-up with PCP in 3-5 days , including all  additional recommended appointments as below Follow-up CBC, CMP in 3-5 days       Current Discharge Medication List    START taking these medications   Details  polyethylene glycol (MIRALAX / GLYCOLAX) packet Take 17 g by mouth 2 (two) times daily. Qty: 14 each, Refills: 0      CONTINUE these medications which have NOT CHANGED   Details  aspirin EC 81 MG tablet Take 81 mg by mouth daily.    Calcium Carb-Cholecalciferol (CALCIUM 600 + D PO) Take 1 tablet by mouth daily.    levothyroxine (SYNTHROID, LEVOTHROID) 100 MCG tablet Take 50 mcg by mouth daily before breakfast.    Multiple Vitamins-Minerals (MULTIVITAMIN WITH MINERALS) tablet Take 1 tablet by mouth daily.    phenytoin (DILANTIN) 100 MG ER capsule Take 100-200 mg by mouth See admin instructions. Take 1 capsule at 8 am, 1 pm and then take 2 capsules at beditme         Discharge Condition: Stable   Discharge Instructions Get Medicines reviewed and adjusted: Please take all your medications with you for your next visit with your Primary MD  Please request your Primary MD to go over all hospital tests and procedure/radiological results at the follow up, please ask your Primary MD to get all Hospital records sent to his/her office.  If you experience worsening of your admission symptoms, develop shortness of breath, life threatening emergency, suicidal or homicidal thoughts you must seek medical attention immediately by  calling 911 or calling your MD immediately if symptoms less severe.  You must read complete instructions/literature along with all the possible adverse reactions/side effects for all the Medicines you take and that have been prescribed to you. Take any new Medicines after you have completely understood and accpet all the possible adverse reactions/side effects.   Do not drive when taking Pain medications.   Do not take more than prescribed Pain, Sleep and Anxiety Medications  Special Instructions: If you have smoked or chewed Tobacco in the last 2 yrs please stop smoking, stop any regular Alcohol and or any Recreational drug use.  Wear Seat belts while driving.  Please note  You were cared for by a hospitalist during your hospital stay. Once you are discharged, your primary care physician will handle any further medical issues. Please note that NO REFILLS for any discharge medications will be authorized once you are discharged, as it is imperative that you return to your primary care physician (or establish a relationship with a primary care physician if you do not have one) for your aftercare needs so that they can reassess your need for medications and monitor your lab values.     No Known Allergies    Disposition: 01-Home or Self Care   Consults:  Surgery    Significant Diagnostic Studies:  Dg Chest 2 View  Result Date: 11/24/2016 CLINICAL DATA:  Acute onset of generalized chest pain, nausea, vomiting and body aches. Initial encounter. EXAM: CHEST  2 VIEW COMPARISON:  Chest  radiograph performed 11/26/2013 FINDINGS: The lungs are well-aerated and clear. There is no evidence of focal opacification, pleural effusion or pneumothorax. The heart is normal in size; the mediastinal contour is within normal limits. No acute osseous abnormalities are seen. Bowel loops are noted interposed superior to the liver. IMPRESSION: No acute cardiopulmonary process seen. Electronically Signed    By: Garald Balding M.D.   On: 11/24/2016 23:42   Dg Abd 1 View  Result Date: 11/27/2016 CLINICAL DATA:  Followup small bowel obstruction EXAM: ABDOMEN - 1 VIEW COMPARISON:  11/26/2016 FINDINGS: Scattered large and small bowel gas is noted. A few mildly dilated loops of small bowel remain. The contrast in the colon continues to progress into the left: . No other focal abnormality is noted. IMPRESSION: Stable mildly dilated small bowel. Contrast material in the colon continues to move distally. Electronically Signed   By: Inez Catalina M.D.   On: 11/27/2016 09:20   Ct Abdomen Pelvis W Contrast  Result Date: 11/25/2016 CLINICAL DATA:  Epigastric pain with vomiting EXAM: CT ABDOMEN AND PELVIS WITH CONTRAST TECHNIQUE: Multidetector CT imaging of the abdomen and pelvis was performed using the standard protocol following bolus administration of intravenous contrast. CONTRAST:  22m ISOVUE-300 IOPAMIDOL (ISOVUE-300) INJECTION 61% COMPARISON:  None. FINDINGS: Lower chest: Lung bases demonstrate no acute consolidation or effusion. Patchy dependent atelectasis. Normal heart size. Hepatobiliary: No focal hepatic abnormality. No calcified gallstones or biliary dilatation. Pancreas: Unremarkable. No pancreatic ductal dilatation or surrounding inflammatory changes. Spleen: Normal in size without focal abnormality. Adrenals/Urinary Tract: Adrenal glands are unremarkable. Kidneys are normal, without renal calculi, focal lesion, or hydronephrosis. Bladder is unremarkable. Stomach/Bowel: The stomach is moderately enlarged. There are multiple fluid-filled loops of borderline to slightly enlarged small bowel, measuring up to 3 cm. Possible transition point in the right upper quadrant anterior to the liver. Decompressed distal small bowel and colon. Appendix non identified. No colon wall thickening. Sigmoid colon diverticular disease. Vascular/Lymphatic: Aortic atherosclerosis. No enlarged abdominal or pelvic lymph nodes. IVC  filter in the infrarenal IVC. Reproductive: Uterus and bilateral adnexa are unremarkable. Other: Small free fluid in the pelvis. No free air. Fat in the umbilicus. Musculoskeletal: No acute or suspicious bone lesion. IMPRESSION: 1. There are multiple borderline to slightly enlarged fluid-filled loops of small bowel with collapsed distal small bowel in the right upper quadrant, findings are concerning for a developing bowel obstruction. 2. Sigmoid colon diverticular disease without acute inflammation. 3. Small amount of free fluid in the pelvis Electronically Signed   By: KDonavan FoilM.D.   On: 11/25/2016 03:47   Dg Abd 2 Views  Result Date: 11/25/2016 CLINICAL DATA:  Small bowel obstruction. EXAM: ABDOMEN - 2 VIEW COMPARISON:  CT of the abdomen and pelvis on 11/25/2016 FINDINGS: Beneath the right hemidiaphragm, there is an air-fluid collection which may be within a loop of bowel based on prior CT exam. However is difficult to exclude free intraperitoneal air in the setting of obstruction. There are dilated loops of small bowel within the central abdomen, associated with air-fluid levels on the erect view. The colon is not dilated. There is residual contrast in colon. Contrast is also identified within the bladder following recent CT exam. IVC filter overlies L3. IMPRESSION: 1. Persistent small bowel obstruction. 2. Cannot entirely exclude free intraperitoneal air. 3. A left lateral decubitus view of the abdomen is recommended for further evaluation. These results will be called to the ordering clinician or representative by the Radiologist Assistant, and communication documented in the  PACS or zVision Dashboard. Electronically Signed   By: Nolon Nations M.D.   On: 11/25/2016 14:18   Dg Abd Portable 1v  Result Date: 11/26/2016 CLINICAL DATA:  8 hour small-bowel followup EXAM: PORTABLE ABDOMEN - 1 VIEW COMPARISON:  11/25/2016 FINDINGS: Nasogastric catheter remains in the stomach. Previously administered  contrast material lies predominately within the colon although some remains in the small bowel. Mild persistent small bowel dilatation is seen although overall improved from the previous exam. An IVC filter is again noted in place. IMPRESSION: Previously administered contrast now lies within the colon. Mild improvement in the degree of small bowel dilatation. Electronically Signed   By: Inez Catalina M.D.   On: 11/26/2016 09:41   Dg Abd Portable 1v-small Bowel Protocol-position Verification  Result Date: 11/25/2016 CLINICAL DATA:  61 year old female status post NG tube placement. EXAM: PORTABLE ABDOMEN - 1 VIEW COMPARISON:  Radiograph dated 11/25/2016 FINDINGS: There has been interval placement of an enteric tube with sideport in the proximal stomach and tip in the region of the body of the stomach. Multiple dilated air-filled loops of small bowel again noted measuring up to 4.1 cm in the mid abdomen. No definite free air identified. An IVC filter is again seen. Partially visualized distended urinary bladder containing excreted intravenous contrast. IMPRESSION: Interval placement of an enteric tube with tip in the gastric body. Persistent dilatation of small-bowel loops. Electronically Signed   By: Anner Crete M.D.   On: 11/25/2016 23:29        Filed Weights   11/24/16 2216 11/25/16 0659  Weight: 68 kg (150 lb) 74.7 kg (164 lb 10.9 oz)      Labs: Results for orders placed or performed during the hospital encounter of 11/25/16 (from the past 48 hour(s))  Magnesium     Status: None   Collection Time: 11/26/16  9:05 AM  Result Value Ref Range   Magnesium 2.0 1.7 - 2.4 mg/dL  TSH     Status: None   Collection Time: 11/26/16  9:05 AM  Result Value Ref Range   TSH 1.420 0.350 - 4.500 uIU/mL    Comment: Performed by a 3rd Generation assay with a functional sensitivity of <=0.01 uIU/mL.  T4, free     Status: None   Collection Time: 11/26/16  9:05 AM  Result Value Ref Range   Free T4 0.95  0.61 - 1.12 ng/dL    Comment: (NOTE) Biotin ingestion may interfere with free T4 tests. If the results are inconsistent with the TSH level, previous test results, or the clinical presentation, then consider biotin interference. If needed, order repeat testing after stopping biotin.   Comprehensive metabolic panel     Status: Abnormal   Collection Time: 11/27/16  6:11 AM  Result Value Ref Range   Sodium 142 135 - 145 mmol/L   Potassium 3.9 3.5 - 5.1 mmol/L   Chloride 112 (H) 101 - 111 mmol/L   CO2 23 22 - 32 mmol/L   Glucose, Bld 84 65 - 99 mg/dL   BUN 11 6 - 20 mg/dL   Creatinine, Ser 0.96 0.44 - 1.00 mg/dL   Calcium 8.1 (L) 8.9 - 10.3 mg/dL   Total Protein 5.1 (L) 6.5 - 8.1 g/dL   Albumin 2.7 (L) 3.5 - 5.0 g/dL   AST 50 (H) 15 - 41 U/L   ALT 50 14 - 54 U/L   Alkaline Phosphatase 67 38 - 126 U/L   Total Bilirubin 0.5 0.3 - 1.2 mg/dL   GFR  calc non Af Amer >60 >60 mL/min   GFR calc Af Amer >60 >60 mL/min    Comment: (NOTE) The eGFR has been calculated using the CKD EPI equation. This calculation has not been validated in all clinical situations. eGFR's persistently <60 mL/min signify possible Chronic Kidney Disease.    Anion gap 7 5 - 15  CBC     Status: Abnormal   Collection Time: 11/27/16  6:11 AM  Result Value Ref Range   WBC 4.3 4.0 - 10.5 K/uL   RBC 3.06 (L) 3.87 - 5.11 MIL/uL   Hemoglobin 10.0 (L) 12.0 - 15.0 g/dL   HCT 30.5 (L) 36.0 - 46.0 %   MCV 99.7 78.0 - 100.0 fL   MCH 32.7 26.0 - 34.0 pg   MCHC 32.8 30.0 - 36.0 g/dL   RDW 12.4 11.5 - 15.5 %   Platelets 126 (L) 150 - 400 K/uL        HPI :   61 y.o. 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 Pain started less than 24 hours prior to presentation to the hospital. CT ABD/PELV W/: fluid filled loops of small bowel in RUQ with borderline dilation (3 cm) and  decompressed distal small bowel. Diverticulosis present. Patient admitted for small bowel obstruction. Surgery consulted  HOSPITAL COURSE:    SBO (small bowel obstruction) (HCC):as shown by CT scan. Patient presented with mild leukocytosis, but no fever. Clinically nonseptic. Gen. surgery consulted Patient had NG tube placed 6/17.  Serial KUB showed improvement -NG tube clamped and removed 6/18.    Patient had bowel movements on today's prior to discharge Type of advance from clear liquids to full liquids to soft diet which he tolerated well Patient started on MiraLAX and aggressive constipation regimen    Seizures ([place]): Seizure free during this hospitalization Continue Dilantin Dilantin level 5.6, this admission  Hypothyroidism:   TSH 1.42, free T4 0.95 -swicth to oral synthroid  once NG tube is discontinued  CKD (chronic kidney disease), stage III:  Close to baseline. Baseline creatinine 1.13 on 11/26/13. Her creatinine is 0.96   Follow-up renal function as outpatient   Discharge Exam: *  Blood pressure 136/66, pulse 60, temperature 99.1 F (37.3 C), resp. rate 20, height '5\' 3"'  (1.6 m), weight 74.7 kg (164 lb 10.9 oz), SpO2 99 %.  Cardiovascular system: S1 & S2 heard, RRR. No JVD, murmurs, rubs, gallops or clicks. No pedal edema. Gastrointestinal system: Decreased bowel sounds , Abdomen nontender. No organomegaly or masses felt. Central nervous system: Alert and oriented. No focal neurological deficits. Extremities: Symmetric 5 x 5 power. Skin: No rashes, lesions or ulcers Psychiatry: Judgement and insight appear normal. Mood & affect appropriate    Follow-up Information    PCP. Call.   Why:  To make follow-up appointment, see PCP  in the next 3-5 days          Signed: Reyne Dumas 11/28/2016, 8:25 AM        Time spent >1 hour

## 2017-10-29 ENCOUNTER — Emergency Department (HOSPITAL_COMMUNITY)
Admission: EM | Admit: 2017-10-29 | Discharge: 2017-10-29 | Disposition: A | Discharge status: Home / Self Care | Payer: Medicaid Other | Attending: Emergency Medicine | Admitting: Emergency Medicine

## 2017-10-29 ENCOUNTER — Other Ambulatory Visit: Payer: Self-pay

## 2017-10-29 ENCOUNTER — Emergency Department (HOSPITAL_COMMUNITY): Payer: Medicaid Other

## 2017-10-29 DIAGNOSIS — W01198A Fall on same level from slipping, tripping and stumbling with subsequent striking against other object, initial encounter: Secondary | ICD-10-CM | POA: Insufficient documentation

## 2017-10-29 DIAGNOSIS — Y9389 Activity, other specified: Secondary | ICD-10-CM | POA: Diagnosis not present

## 2017-10-29 DIAGNOSIS — S6991XA Unspecified injury of right wrist, hand and finger(s), initial encounter: Secondary | ICD-10-CM | POA: Diagnosis present

## 2017-10-29 DIAGNOSIS — Z79899 Other long term (current) drug therapy: Secondary | ICD-10-CM | POA: Insufficient documentation

## 2017-10-29 DIAGNOSIS — Z7982 Long term (current) use of aspirin: Secondary | ICD-10-CM | POA: Diagnosis not present

## 2017-10-29 DIAGNOSIS — N183 Chronic kidney disease, stage 3 (moderate): Secondary | ICD-10-CM | POA: Insufficient documentation

## 2017-10-29 DIAGNOSIS — E039 Hypothyroidism, unspecified: Secondary | ICD-10-CM | POA: Insufficient documentation

## 2017-10-29 DIAGNOSIS — Y998 Other external cause status: Secondary | ICD-10-CM | POA: Insufficient documentation

## 2017-10-29 DIAGNOSIS — Y929 Unspecified place or not applicable: Secondary | ICD-10-CM | POA: Insufficient documentation

## 2017-10-29 DIAGNOSIS — S52501A Unspecified fracture of the lower end of right radius, initial encounter for closed fracture: Secondary | ICD-10-CM

## 2017-10-29 MED ORDER — HYDROCODONE-ACETAMINOPHEN 5-325 MG PO TABS: 1.0000 | ORAL_TABLET | Freq: Four times a day (QID) | ORAL | 0 refills | Status: DC | PRN

## 2017-10-29 NOTE — ED Triage Notes (Signed)
Pt reports fall and pain to R wrist onset this afternoon. No deformity noted.

## 2017-10-29 NOTE — Progress Notes (Signed)
Orthopedic Tech Progress Note Patient Details:  Karla [name] Nov 01, 1955 [id number]  Ortho Devices Type of Ortho Device: Ace wrap, Sugartong splint, Arm sling Ortho Device/Splint Location: RUE Ortho Device/Splint Interventions: Ordered, Application   Post Interventions Patient Tolerated: Well Instructions Provided: Care of device   Jennye Moccasin 10/29/2017, 9:57 PM

## 2017-10-29 NOTE — ED Notes (Signed)
Patient transported to X-ray 

## 2017-10-29 NOTE — ED Notes (Signed)
Pt had a mechanical fall. Pt a one touch pt, see provider's note.

## 2017-10-29 NOTE — ED Provider Notes (Signed)
[facility] EMERGENCY DEPARTMENT Provider Note   CSN: [id number] Arrival date & time: 10/29/17  1947     History   Chief Complaint Chief Complaint  Patient presents with  . Hand Pain    HPI Karla [name] is a 62 y.o. female.  HPI   62 year old female presents status post fall.  Patient reports she tripped landing on an outstretched hand.  She notes obvious swelling to the dorsum of the proximal right wrist.  She denies any loss of distal sensation strength and motor function of the hand, decreased range of motion of the wrist secondary to pain, no other injuries noted no medications prior to arrival.  No history of trauma to the wrist in the past.  Past Medical History:  Diagnosis Date  . Cardiac arrest (HCC) 2004  . Endocarditis   . Hypothyroidism   . Lupus   . Seizures (HCC)   . Thyroid disease    hypothyroidism    Patient Active Problem List   Diagnosis Date Noted  . Encounter for imaging study to confirm nasogastric (NG) tube placement   . CKD (chronic kidney disease), stage III (HCC) 11/25/2016  . SBO (small bowel obstruction) (HCC) 11/25/2016  . Seizures (HCC)   . Hypothyroidism     Past Surgical History:  Procedure Laterality Date  . APPENDECTOMY    . CESAREAN SECTION    . PATELLA FRACTURE SURGERY    . TUBAL LIGATION       OB History   None      Home Medications    Prior to Admission medications   Medication Sig Start Date End Date Taking? Authorizing Provider  aspirin EC 81 MG tablet Take 81 mg by mouth daily.    [provider]  Calcium Carb-Cholecalciferol (CALCIUM 600 + D PO) Take 1 tablet by mouth daily.    [provider]  HYDROcodone-acetaminophen (NORCO/VICODIN) 5-325 MG tablet Take 1 tablet by mouth every 6 (six) hours as needed. 10/29/17   Tiya Schrupp, Tinnie Gens, PA-C  levothyroxine (SYNTHROID, LEVOTHROID) 100 MCG tablet Take 50 mcg by mouth daily before breakfast.    [provider]  Multiple  Vitamins-Minerals (MULTIVITAMIN WITH MINERALS) tablet Take 1 tablet by mouth daily.    [provider]  phenytoin (DILANTIN) 100 MG ER capsule Take 100-200 mg by mouth See admin instructions. Take 1 capsule at 8 am, 1 pm and then take 2 capsules at beditme    [provider]  polyethylene glycol (MIRALAX / GLYCOLAX) packet Take 17 g by mouth 2 (two) times daily. 11/28/16   Richarda Overlie, MD    Family History Family History  Problem Relation Age of Onset  . Lung cancer Father   . Thyroid disease Sister     Social History Social History   Tobacco Use  . Smoking status: Never Smoker  . Smokeless tobacco: Never Used  Substance Use Topics  . Alcohol use: No  . Drug use: No     Allergies   Patient has no known allergies.   Review of Systems Review of Systems  All other systems reviewed and are negative.    Physical Exam Updated Vital Signs BP (!) 141/82 (BP Location: Right Arm)   Pulse 77   Temp 98.4 F (36.9 C) (Oral)   Resp 16   Ht  (1.6 m)   Wt 74.4 kg (164 lb)   SpO2 100%   BMI 29.05 kg/m   Physical Exam  Constitutional: She is oriented to person,  place, and time. She appears well-developed and well-nourished.  HENT:  Head: Normocephalic and atraumatic.  Eyes: Pupils are equal, round, and reactive to light. Conjunctivae are normal. Right eye exhibits no discharge. Left eye exhibits no discharge. No scleral icterus.  Neck: Normal range of motion. No JVD present. No tracheal deviation present.  Pulmonary/Chest: Effort normal. No stridor.  Musculoskeletal:  Swelling noted over the distal dorsal forearm and wrist, sensation intact, range of motion intact to the fingers, radial pulse 2+  Elbow and shoulder nontender  Neurological: She is alert and oriented to person, place, and time. Coordination normal.  Psychiatric: She has a normal mood and affect. Her behavior is normal. Judgment and thought content normal.  Nursing note and vitals  reviewed.    ED Treatments / Results  Labs (all labs ordered are listed, but only abnormal results are displayed) Labs Reviewed - No data to display  EKG None  Radiology Dg Wrist Complete Right  Result Date: 10/29/2017 CLINICAL DATA:  Right wrist pain after fall at home today after losing balance. EXAM: RIGHT WRIST - COMPLETE 3+ VIEW COMPARISON:  None. FINDINGS: Impacted mildly comminuted and displaced fracture of the distal radial metaphysis with displacement primarily involving the volar cortex. Mild anterior translation and proximal migration of dominant distal fracture fragment. Fracture extends to the dorsal aspect of the radiocarpal joint. There is offset of the distal radioulnar joint. No definite ulna styloid fracture. Carpal bones are intact. Soft tissue edema about the wrist. : Displaced impacted fracture of the distal radial metaphysis with extension to the radiocarpal joint and disruption of the distal radioulnar joint. Electronically Signed   By: Rubye Oaks M.D.   On: 10/29/2017 21:13    Procedures Procedures (including critical care time)  Medications Ordered in ED Medications - No data to display   Initial Impression / Assessment and Plan / ED Course  I have reviewed the triage vital signs and the nursing notes.  Pertinent labs & imaging results that were available during my care of the patient were reviewed by me and considered in my medical decision making (see chart for details).       Final Clinical Impressions(s) / ED Diagnoses   Final diagnoses:  Closed fracture of distal end of right radius, unspecified fracture morphology, initial encounter   Labs:   Imaging: DG wrist  Consults: Dr. Amanda Pea  Therapeutics:  Discharge Meds: Hydrocodone  Assessment/Plan: Patient presents with distal radius fracture.  She has no signs of vascular or neuro complications.  Case discussed with on-call hand surgeon Dr. Cheree Ditto and he will see the patient in the  office tomorrow.  Patient placed in a splint here, discharged home with follow-up information and strict return precautions.  She verbalized understanding and agreement to today's plan.   ED Discharge Orders        Ordered    HYDROcodone-acetaminophen (NORCO/VICODIN) 5-325 MG tablet  Every 6 hours PRN     10/29/17 2211       Eyvonne Mechanic, PA-C 10/29/17 2213    Eber Hong, MD 11/02/17 [phone]

## 2017-10-29 NOTE — ED Notes (Signed)
Otho returned page, will be here shortly to apply splint

## 2017-10-29 NOTE — Discharge Instructions (Addendum)
Please read attached information. If you experience any new or worsening signs or symptoms please return to the emergency room for evaluation. Please follow-up with your primary care provider or specialist as discussed. Please use medication prescribed only as directed and discontinue taking if you have any concerning signs or symptoms.   °

## 2017-10-30 DIAGNOSIS — M25531 Pain in right wrist: Secondary | ICD-10-CM | POA: Insufficient documentation

## 2017-11-01 DIAGNOSIS — S52509A Unspecified fracture of the lower end of unspecified radius, initial encounter for closed fracture: Secondary | ICD-10-CM | POA: Insufficient documentation

## 2017-11-11 LAB — PROTIME-INR

## 2018-02-19 ENCOUNTER — Other Ambulatory Visit: Payer: Self-pay | Admitting: Family Medicine

## 2018-02-19 ENCOUNTER — Encounter: Payer: Self-pay | Admitting: Rheumatology

## 2018-02-19 DIAGNOSIS — Z1231 Encounter for screening mammogram for malignant neoplasm of breast: Secondary | ICD-10-CM

## 2018-02-26 ENCOUNTER — Encounter: Payer: Self-pay | Admitting: Neurology

## 2018-03-03 ENCOUNTER — Encounter: Payer: Self-pay | Admitting: Gastroenterology

## 2018-03-20 ENCOUNTER — Ambulatory Visit: Payer: Medicaid Other

## 2018-03-24 NOTE — Progress Notes (Signed)
Office Visit Note  Patient: Karla [name]             Date of Birth: 03-16-1956           MRN: [id number]             PCP: System, Pcp Not In Referring: Vassie Moment, MD Visit Date: 04/07/2018 Occupation: _0 @  Subjective:  History of lupus   History of Present Illness: Karla [name] is a 62 y.o. female seen in consultation per request of Karla [name] PCP.  She has been accompanied by Karla [name] husband today.  The history was obtained from patient and Karla [name] husband today.  According to them patient was diagnosed with staph endocarditis in 2004.  At that time she was also diagnosed with lupus based on Karla [name] lab results.  According to him she was seen by Karla [name] rheumatologist who kept Karla [name] on some medication they do not recall for 2 years and then she was told that she was in remission and she was taken off the medications.  She has not had any treatment since then.  She also developed having with mild seizures since then.  For which she has been taking Dilantin and had occasional seizures.  She will be establishing with a neurologist here.  She had renal failure at the time but now Karla [name] kidney functions are normal per patient.  She denies any history of joint pain or joint swelling.  There is no history of oral ulcers, nasal ulcers, malar rash, raynauds phenomenon.  Activities of Daily Living:  Patient reports morning stiffness for 0 minute.   Patient Denies nocturnal pain.  Difficulty dressing/grooming: Denies Difficulty climbing stairs: reports (balance issues) Difficulty getting out of chair: Denies Difficulty using hands for taps, buttons, cutlery, and/or writing: Denies  Review of Systems  Constitutional: Positive for fatigue. Negative for night sweats, weight gain and weight loss.  HENT: Negative for mouth sores, trouble swallowing, trouble swallowing, mouth dryness and nose dryness.   Eyes: Negative for pain, redness, visual disturbance and dryness.  Respiratory: Negative for cough,  shortness of breath and difficulty breathing.   Cardiovascular: Negative for chest pain, palpitations, hypertension, irregular heartbeat and swelling in legs/feet.  Gastrointestinal: Negative for blood in stool, constipation and diarrhea.  Endocrine: Negative for increased urination.  Genitourinary: Negative for vaginal dryness.  Musculoskeletal: Negative for arthralgias, joint pain, joint swelling, myalgias, muscle weakness, morning stiffness, muscle tenderness and myalgias.  Skin: Negative for color change, rash, hair loss, skin tightness, ulcers and sensitivity to sunlight.  Allergic/Immunologic: Negative for susceptible to infections.  Neurological: Positive for dizziness, seizures and weakness. Negative for memory loss and night sweats.       Difficulty with balance  Hematological: Negative for swollen glands.  Psychiatric/Behavioral: Negative for depressed mood and sleep disturbance. The patient is not nervous/anxious.     PMFS History:  Patient Active Problem List   Diagnosis Date Noted  . Encounter for imaging study to confirm nasogastric (NG) tube placement   . CKD (chronic kidney disease), stage III ([place]) 11/25/2016  . SBO (small bowel obstruction) ([place]) 11/25/2016  . Seizures ([place])   . Hypothyroidism     Past Medical History:  Diagnosis Date  . Cardiac arrest ([place]) 2004  . Endocarditis   . Hypothyroidism   . Lupus ([place])   . Seizures ([place])   . Thyroid disease    hypothyroidism    Family History  Problem Relation Age of Onset  . Lung cancer Father   . Thyroid disease Sister   .  Thyroid disease Sister   . Healthy Son    Past Surgical History:  Procedure Laterality Date  . APPENDECTOMY    . CESAREAN SECTION    . PATELLA FRACTURE SURGERY    . TUBAL LIGATION     Social History   Social History Narrative  . Not on file    Objective: Vital Signs: BP 139/86 (BP Location: Right Arm, Patient Position: Sitting, Cuff Size: Normal)   Pulse 71   Resp 15   Ht _0   (1.626 m)   Wt 163 lb (73.9 kg)   BMI 27.98 kg/m    Physical Exam  Constitutional: She is oriented to person, place, and time. She appears well-developed and well-nourished.  HENT:  Head: Normocephalic and atraumatic.  Eyes: Conjunctivae and EOM are normal.  Neck: Normal range of motion.  Cardiovascular: Normal rate, regular rhythm, normal heart sounds and intact distal pulses.  Pulmonary/Chest: Effort normal and breath sounds normal.  Abdominal: Soft. Bowel sounds are normal.  Lymphadenopathy:    She has no cervical adenopathy.  Neurological: She is alert and oriented to person, place, and time.  Patient has problems with balance.  Skin: Skin is warm and dry. Capillary refill takes less than 2 seconds.  Psychiatric: She has a normal mood and affect. Karla [name] behavior is normal.  Nursing note and vitals reviewed.    Musculoskeletal Exam: C-spine thoracic lumbar spine good range of motion.  Shoulder joints elbow joints wrist joint MCPs PIPs DIPs were in good range of motion with no synovitis.  Hip joints knee joints ankles MTPs PIPs were in good range of motion with no synovitis.  Posttraumatic changes were noted in Karla [name] right wrist, right knee and left lower extremity.  CDAI Exam: CDAI Score: Not documented Patient Global Assessment: Not documented; Provider Global Assessment: Not documented Swollen: Not documented; Tender: Not documented Joint Exam   Not documented   There is currently no information documented on the homunculus. Go to the Rheumatology activity and complete the homunculus joint exam.  Investigation: No additional findings.  Imaging: No results found.  Recent Labs: Lab Results  Component Value Date   WBC 4.3 11/27/2016   HGB 10.0 (L) 11/27/2016   PLT 126 (L) 11/27/2016   NA 142 11/27/2016   K 3.9 11/27/2016   CL 112 (H) 11/27/2016   CO2 23 11/27/2016   GLUCOSE 84 11/27/2016   BUN 11 11/27/2016   CREATININE 0.96 11/27/2016   BILITOT 0.5 11/27/2016    ALKPHOS 67 11/27/2016   AST 50 (H) 11/27/2016   ALT 50 11/27/2016   PROT 5.1 (L) 11/27/2016   ALBUMIN 2.7 (L) 11/27/2016   CALCIUM 8.1 (L) 11/27/2016   GFRAA >60 11/27/2016  Vitamin D 46 February 19, 2018 CBC showed hemoglobin 11.0, CMP GFR 57, C4 low at 12, C3 111, dsDNA 16 elevated, ESR 40 normal  Speciality Comments: No specialty comments available.  Procedures:  No procedures performed Allergies: Patient has no known allergies.   Assessment / Plan:     Visit Diagnoses: Other systemic lupus erythematosus with other organ involvement ([place]) - 02/19/2018 C3 111, C4 12,  dsDNA 16, Sed Rate 40, Alk phos 154 -patient reports that she was diagnosed with systemic lupus erythematosus in 2004 she was on treatment for 2 years and then all the medications were taken off.  She was told that she was in remission.  She has no systemic features of systemic lupus currently.  Recent labs showed positive double-stranded DNA and hypocomplementemia for that  reason she was referred to me.  We will obtain following labs today.  Plan: Urinalysis, Routine w reflex microscopic, CK, ANA, Anti-scleroderma antibody, RNP Antibody, Anti-Smith antibody, Sjogrens syndrome-A extractable nuclear antibody, Sjogrens syndrome-B extractable nuclear antibody, C3 and C4, Beta-2 glycoprotein antibodies, Cardiolipin antibodies, IgG, IgM, IgA, Lupus Anticoagulant Eval w/Reflex, Glucose 6 phosphate dehydrogenase  Seizure disorder (HCC) - has scheduled appt with Dr. Ellouise Newer on 04/28/2018.  Patient has been reported that she expected mild seizures still occasional.  History of hypothyroidism  History of chronic kidney disease-Karla [name] most recent GFR was 57.  History of anemia-she has chronic anemia.  History of endocarditis-2004.  History of small bowel obstruction   Orders: Orders Placed This Encounter  Procedures  . Urinalysis, Routine w reflex microscopic  . CK  . ANA  . Anti-scleroderma antibody  . RNP Antibody  .  Anti-Smith antibody  . Sjogrens syndrome-A extractable nuclear antibody  . Sjogrens syndrome-B extractable nuclear antibody  . C3 and C4  . Beta-2 glycoprotein antibodies  . Cardiolipin antibodies, IgG, IgM, IgA  . Lupus Anticoagulant Eval w/Reflex  . Glucose 6 phosphate dehydrogenase   No orders of the defined types were placed in this encounter.   Face-to-face time spent with patient was 50 minutes. Greater than 50% of time was spent in counseling and coordination of care.  Follow-Up Instructions: Return for History of SLE.   Bo Merino, MD  Note - This record has been created using Editor, commissioning.  Chart creation errors have been sought, but may not always  have been located. Such creation errors do not reflect on  the standard of medical care.

## 2018-04-07 ENCOUNTER — Encounter (INDEPENDENT_AMBULATORY_CARE_PROVIDER_SITE_OTHER): Payer: Self-pay

## 2018-04-07 ENCOUNTER — Ambulatory Visit: Payer: Medicaid Other | Admitting: Rheumatology

## 2018-04-07 ENCOUNTER — Ambulatory Visit (AMBULATORY_SURGERY_CENTER): Payer: Self-pay

## 2018-04-07 ENCOUNTER — Encounter: Payer: Self-pay | Admitting: Rheumatology

## 2018-04-07 VITALS — BP 139/86 | HR 71 | Resp 15 | Ht 64.0 in | Wt 163.0 lb

## 2018-04-07 VITALS — Ht 64.0 in | Wt 162.8 lb

## 2018-04-07 DIAGNOSIS — M3219 Other organ or system involvement in systemic lupus erythematosus: Secondary | ICD-10-CM

## 2018-04-07 DIAGNOSIS — Z8639 Personal history of other endocrine, nutritional and metabolic disease: Secondary | ICD-10-CM

## 2018-04-07 DIAGNOSIS — G40909 Epilepsy, unspecified, not intractable, without status epilepticus: Secondary | ICD-10-CM

## 2018-04-07 DIAGNOSIS — Z8679 Personal history of other diseases of the circulatory system: Secondary | ICD-10-CM

## 2018-04-07 DIAGNOSIS — Z8719 Personal history of other diseases of the digestive system: Secondary | ICD-10-CM

## 2018-04-07 DIAGNOSIS — Z1211 Encounter for screening for malignant neoplasm of colon: Secondary | ICD-10-CM

## 2018-04-07 DIAGNOSIS — Z862 Personal history of diseases of the blood and blood-forming organs and certain disorders involving the immune mechanism: Secondary | ICD-10-CM | POA: Diagnosis not present

## 2018-04-07 MED ORDER — NA SULFATE-K SULFATE-MG SULF 17.5-3.13-1.6 GM/177ML PO SOLN: 1.0000 | Freq: Once | ORAL | 0 refills | Status: AC

## 2018-04-07 NOTE — Progress Notes (Signed)
Denies allergies to eggs or soy products. Denies complication of anesthesia or sedation. Denies use of weight loss medication. Denies use of O2.   Emmi instructions declined.   Extra time was spent explaining instructions. 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 [name] is on medication. Karla [name] was in my office while I was preparing Karla patients chart. Karla [name] states that it is ok for Karla [name] to proceed at [facility]. [name] states she may have seizure every few months.

## 2018-04-08 NOTE — Progress Notes (Signed)
UA c/w UTI. Please, ask pt to see her PCP or urgent care Tt.

## 2018-04-09 LAB — SJOGRENS SYNDROME-A EXTRACTABLE NUCLEAR ANTIBODY: SSA (Ro) (ENA) Antibody, IgG: <1 AI

## 2018-04-09 LAB — URINALYSIS, ROUTINE W REFLEX MICROSCOPIC
Bilirubin Urine: NEGATIVE
GLUCOSE, UA: NEGATIVE
Ketones, ur: NEGATIVE
Nitrite: POSITIVE — AB
Specific Gravity, Urine: 1.02 (ref 1.001–1.03)
pH: <=5 (ref 5.0–8.0)

## 2018-04-09 LAB — LUPUS ANTICOAGULANT EVAL W/ REFLEX
DRVVT: 131 s — AB (ref <=45)
PT MIXING INTERP: POSITIVE — AB
PTT-LA SCREEN: 69 s — AB (ref <=40)

## 2018-04-09 LAB — THROMBIN CLOTTING TIME: THROMBIN CLOTTING TIME: 18 s (ref 13–19)

## 2018-04-09 LAB — RFX DRVVT 1:1 MIX

## 2018-04-09 LAB — CARDIOLIPIN ANTIBODIES, IGG, IGM, IGA
ANTICARDIOLIPIN IGA: 44 [APL'U] — AB
ANTICARDIOLIPIN IGM: 43 [MPL'U] — AB
Anticardiolipin IgG: 90 [GPL'U] — ABNORMAL HIGH

## 2018-04-09 LAB — RNP ANTIBODY: RIBONUCLEIC PROTEIN(ENA) ANTIBODY, IGG: POSITIVE AI — AB

## 2018-04-09 LAB — ANTI-SCLERODERMA ANTIBODY: Scleroderma (Scl-70) (ENA) Antibody, IgG: <1 AI

## 2018-04-09 LAB — RFLX HEXAGONAL PHASE CONFIRM: Hexagonal Phase Conf: POSITIVE — AB

## 2018-04-09 LAB — C3 AND C4
C3 COMPLEMENT: 118 mg/dL (ref 83–193)
C4 Complement: 13 mg/dL — ABNORMAL LOW (ref 15–57)

## 2018-04-09 LAB — ANTI-NUCLEAR AB-TITER (ANA TITER): ANA Titer 1: 1:1280 {titer} — ABNORMAL HIGH

## 2018-04-09 LAB — SJOGRENS SYNDROME-B EXTRACTABLE NUCLEAR ANTIBODY: SSB (La) (ENA) Antibody, IgG: <1 AI

## 2018-04-09 LAB — ANTI-SMITH ANTIBODY: ENA SM AB SER-ACNC: NEGATIVE AI

## 2018-04-09 LAB — BETA-2 GLYCOPROTEIN ANTIBODIES
BETA 2 GLYCO 1 IGA: 150 SAU — AB (ref <=20)
BETA 2 GLYCO 1 IGM: 150 SMU — AB (ref <=20)
Beta-2 Glyco I IgG: 106 SGU — ABNORMAL HIGH (ref <=20)

## 2018-04-09 LAB — CK: CK TOTAL: 31 U/L (ref 29–143)

## 2018-04-09 LAB — GLUCOSE 6 PHOSPHATE DEHYDROGENASE: G-6PDH: 16.7 U/g Hgb (ref 7.0–20.5)

## 2018-04-09 LAB — ANA: ANA: POSITIVE — AB

## 2018-04-09 LAB — RFLX DRVVT CONFRIM: DRVVT CONFIRM: POSITIVE — AB

## 2018-04-18 ENCOUNTER — Ambulatory Visit
Admission: RE | Admit: 2018-04-18 | Discharge: 2018-04-18 | Disposition: A | Discharge status: Home / Self Care | Payer: Medicaid Other | Source: Ambulatory Visit | Attending: Family Medicine | Admitting: Family Medicine

## 2018-04-18 DIAGNOSIS — Z862 Personal history of diseases of the blood and blood-forming organs and certain disorders involving the immune mechanism: Secondary | ICD-10-CM | POA: Insufficient documentation

## 2018-04-18 DIAGNOSIS — Z8679 Personal history of other diseases of the circulatory system: Secondary | ICD-10-CM | POA: Insufficient documentation

## 2018-04-18 DIAGNOSIS — Z1231 Encounter for screening mammogram for malignant neoplasm of breast: Secondary | ICD-10-CM

## 2018-04-18 DIAGNOSIS — Z8639 Personal history of other endocrine, nutritional and metabolic disease: Secondary | ICD-10-CM | POA: Insufficient documentation

## 2018-04-18 NOTE — Progress Notes (Signed)
Office Visit Note  Patient: Karla [name]             Date of Birth: Mar 20, 1956           MRN: [id number]             PCP: System, Pcp Not In Referring: No ref. provider found Visit Date: 04/30/2018 Occupation: @GUAROCC @  Subjective:  Abnormal labs and Raynauds   History of Present Illness: Karla [name] is a 62 y.o. female with history of systemic lupus dermatosis.  She returns a follow-up visit today.  According to her she continues to have some Raynolds symptoms.  Her symptoms are worse during the wintertime.  She denies any joint pain or joint swelling.  She has history of fatigue.  Activities of Daily Living:  Patient reports morning stiffness for 0 minutes.   Patient Denies nocturnal pain.  Difficulty dressing/grooming: Denies Difficulty climbing stairs: Reports Difficulty getting out of chair: Denies Difficulty using hands for taps, buttons, cutlery, and/or writing: Denies  Review of Systems  Constitutional: Positive for fatigue.  HENT: Negative for mouth sores, trouble swallowing, trouble swallowing and mouth dryness.   Eyes: Negative for pain, redness, itching and dryness.  Respiratory: Negative for shortness of breath, wheezing and difficulty breathing.   Cardiovascular: Negative for palpitations, irregular heartbeat and swelling in legs/feet.  Gastrointestinal: Negative for abdominal pain, blood in stool, constipation, diarrhea, nausea and vomiting.  Endocrine: Negative for increased urination.  Genitourinary: Negative for painful urination, nocturia and pelvic pain.  Musculoskeletal: Negative for arthralgias, joint pain, joint swelling and morning stiffness.  Skin: Positive for color change. Negative for rash, hair loss and redness.  Allergic/Immunologic: Negative for susceptible to infections.  Neurological: Negative for dizziness, light-headedness, headaches, memory loss and weakness.  Hematological: Negative for bruising/bleeding tendency.    Psychiatric/Behavioral: Negative for confusion. The patient is not nervous/anxious.     PMFS History:  Patient Active Problem List   Diagnosis Date Noted  . Raynaud's disease without gangrene 04/30/2018  . History of hypothyroidism 04/18/2018  . History of endocarditis 04/18/2018  . History of anemia 04/18/2018  . Encounter for imaging study to confirm nasogastric (NG) tube placement   . CKD (chronic kidney disease), stage III (HCC) 11/25/2016  . SBO (small bowel obstruction) (HCC) 11/25/2016  . Seizures (HCC)   . Hypothyroidism     Past Medical History:  Diagnosis Date  . Cardiac arrest (HCC) 2004  . Endocarditis   . Hypothyroidism   . Lupus (HCC)   . Seizures (HCC)    Petit mal seizues a week and a half ago.   . Stroke (HCC)   . Thyroid disease    hypothyroidism    Family History  Problem Relation Age of Onset  . Lung cancer Father   . Thyroid disease Sister   . Thyroid disease Sister   . Healthy Son   . Colon cancer Neg Hx   . Esophageal cancer Neg Hx   . Stomach cancer Neg Hx   . Rectal cancer Neg Hx    Past Surgical History:  Procedure Laterality Date  . APPENDECTOMY    . CESAREAN SECTION    . PATELLA FRACTURE SURGERY    . 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 She appears well-developed and well-nourished.  HENT:  Head: Normocephalic and atraumatic.  Eyes: Conjunctivae and EOM are normal.  Neck: Normal range of motion.  Cardiovascular: Normal rate, regular rhythm, normal heart  sounds and intact distal pulses.  Pulmonary/Chest: Effort normal and breath sounds normal.  Abdominal: Soft. Bowel sounds are normal.  Lymphadenopathy:    She has no cervical adenopathy.  Neurological: She is alert and oriented to person, place, and time.  Skin: Skin is warm and dry. Capillary refill takes less than 2 seconds.  Psychiatric: She has a normal mood and affect. Her behavior is normal.  Nursing note and vitals reviewed.    Musculoskeletal Exam: C-spine thoracic lumbar spine good range of motion.  Shoulder joints elbow joints wrist joint MCPs PIPs DIPs with good range of motion with no synovitis.  Hip joints knee joints ankles MTPs PIPs been good range of motion with no synovitis.  CDAI Exam: CDAI Score: Not documented Patient Global Assessment: Not documented; Provider Global Assessment: Not documented Swollen: Not documented; Tender: Not documented Joint Exam   Not documented   There is currently no information documented on the homunculus. Go to the Rheumatology activity and complete the homunculus joint exam.  Investigation: No additional findings.  Imaging: Mm 3d Screen Breast Bilateral  Result Date: 04/21/2018 CLINICAL DATA:  Screening. EXAM: DIGITAL SCREENING BILATERAL MAMMOGRAM WITH TOMO AND CAD COMPARISON:  None. ACR Breast Density Category c: The breast tissue is heterogeneously dense, which may obscure small masses FINDINGS: There are no findings suspicious for malignancy. Images were processed with CAD. IMPRESSION: No mammographic evidence of malignancy. A result letter of this screening mammogram will be mailed directly to the patient. RECOMMENDATION: Screening mammogram in one year. (Code:SM-B-01Y) BI-RADS CATEGORY  1: Negative. Electronically Signed   By: Sherian Rein M.D.   On: 04/21/2018 11:11    Recent Labs: Lab Results  Component Value Date   WBC 4.3 11/27/2016   HGB 10.0 (L) 11/27/2016   PLT 126 (L) 11/27/2016   NA 142 11/27/2016   K 3.9  11/27/2016   CL 112 (H) 11/27/2016   CO2 23 11/27/2016   GLUCOSE 84 11/27/2016   BUN 11 11/27/2016   CREATININE 0.96 11/27/2016   BILITOT 0.5 11/27/2016   ALKPHOS 67 11/27/2016   AST 50 (H) 11/27/2016   ALT 50 11/27/2016   PROT 5.1 (L) 11/27/2016   ALBUMIN 2.7 (L) 11/27/2016   CALCIUM 8.1 (L) 11/27/2016   GFRAA >60 11/27/2016  UA showed leukocytes and bacteria, CK 31, G6PD normal, Lupus anticoagulant positive, beta-2 positive, anticardiolipin positive, C4 low at 13, ANA 1: 1280, RNP positive (SCL 70-, Smith negative, SSA negative, SSB negative) Will order double-stranded DNA at follow-up visit. Speciality Comments: No specialty comments available.  Procedures:  No procedures performed Allergies: Patient has no known allergies.   Assessment / Plan:     Visit Diagnoses: Other systemic lupus erythematosus with other organ involvement (HCC) - Positive ANA, positive RNP, low C4, beta-2 GP 1+, anticardiolipin positive, lupus anticoagulant positive(plan double-stranded DNA today) patient has long standing history of lupus.  She states she was treated with some medications in  the past for 2 years and then they were discontinued.  She denies any joint swelling or discomfort.  She continues to have some Raynaud's and fatigue.  I detailed discussion regarding her lab work with her.  She has high risk of clotting due to positive anticardiolipin antibody, lupus anticoagulant and beta-2 GP 1.  Her husband states that she has been taking aspirin 81 mg daily.  Indications side effects contraindications of Plaquenil were discussed.  My plan is to start her on Plaquenil 200 mg p.o. twice daily Monday through Friday.  We will check labs in a month and then every 3 months to monitor for drug toxicity.  She has been advised to get a baseline eye examination and then eye exam on yearly basis.  She has been advised to get flu vaccine, pneumococcal vaccine and Shingrix vaccine.  Patient was counseled on the  purpose, proper use, and adverse effects of hydroxychloroquine including nausea/diarrhea, skin rash, headaches, and sun sensitivity.  Discussed importance of annual eye exams while on hydroxychloroquine to monitor to ocular toxicity and discussed importance of frequent laboratory monitoring.  Provided patient with eye exam form for baseline ophthalmologic exam.  Provided patient with educational materials on hydroxychloroquine and answered all questions.  Patient consented to hydroxychloroquine.  Will upload consent in the media tab.    High risk medication use-plan to start her on Plaquenil.  Raynaud's disease without gangrene-warm clothing was discussed.  Seizures (HCC) - Followed by Dr. Patrcia Dolly  History of hypothyroidism  History of endocarditis - 2004  History of anemia   Orders: Orders Placed This Encounter  Procedures  . Anti-DNA antibody, double-stranded  . CBC with Differential/Platelet  . COMPLETE METABOLIC PANEL WITH GFR   Meds ordered this encounter  Medications  . hydroxychloroquine (PLAQUENIL) 200 MG tablet    Sig: Take 1 tablet (200mg ) twice daily Monday through Friday.    Dispense:  40 tablet    Refill:  2    Face-to-face time spent with patient was 30 minutes. Greater than 50% of time was spent in counseling and coordination of care.  Follow-Up Instructions: Return in about 3 months (around 07/31/2018) for Systemic lupus.   Pollyann Savoy, MD  Note - This record has been created using Animal nutritionist.  Chart creation errors have been sought, but may not always  have been located. Such creation errors do not reflect on  the standard of medical care.

## 2018-04-21 ENCOUNTER — Encounter: Payer: Self-pay | Admitting: Gastroenterology

## 2018-04-21 ENCOUNTER — Ambulatory Visit (AMBULATORY_SURGERY_CENTER): Payer: Medicaid Other | Admitting: Gastroenterology

## 2018-04-21 VITALS — BP 110/80 | HR 68 | Temp 97.5°F | Resp 12 | Ht 64.0 in | Wt 162.0 lb

## 2018-04-21 DIAGNOSIS — Z1211 Encounter for screening for malignant neoplasm of colon: Secondary | ICD-10-CM

## 2018-04-21 MED ORDER — SODIUM CHLORIDE 0.9 % IV SOLN: 500.0000 mL | Freq: Once | INTRAVENOUS | Status: DC

## 2018-04-21 NOTE — Progress Notes (Signed)
I have reviewed the patient's medical history in detail and updated the computerized patient record.

## 2018-04-21 NOTE — Op Note (Signed)
[facility] Patient Name: Karla [name] Procedure Date: 04/21/2018 9:56 AM MRN: [id number] Endoscopist: Tressia Danas MD, MD Age: 62 Referring MD:  Date of Birth: 1955/06/14 Gender: Female Account #: [id number] Procedure:                Colonoscopy Indications:              Screening for colorectal malignant neoplasm. No                            known family history of colon cancer or polyps. No                            baseline GI symptoms. Patient had a colonoscopy                            with Dr. Lucretia Roers at [facility] 05/14/2008 for anemia that was                            significant for diverticulosis but no polyps. Medicines:                See the Anesthesia note for documentation of the                            administered medications Procedure:                Pre-Anesthesia Assessment:                           - Prior to the procedure, a History and Physical                            was performed, and patient medications and                            allergies were reviewed. The patient's tolerance of                            previous anesthesia was also reviewed. The risks                            and benefits of the procedure and the sedation                            options and risks were discussed with the patient.                            All questions were answered, and informed consent                            was obtained. Prior Anticoagulants: The patient has                            taken no previous anticoagulant or antiplatelet  agents. ASA Grade Assessment: III - A patient with                            severe systemic disease. After reviewing the risks                            and benefits, the patient was deemed in                            satisfactory condition to undergo the procedure.                           After obtaining informed consent, the colonoscope                            was  passed under direct vision. Throughout the                            procedure, the patient's blood pressure, pulse, and                            oxygen saturations were monitored continuously. The                            Colonoscope was introduced through the anus and                            advanced to the the terminal ileum, with                            identification of the appendiceal orifice and IC                            valve. The colonoscopy was performed without                            difficulty. The patient tolerated the procedure                            well. The quality of the bowel preparation was good. Scope In: 10:25:57 AM Scope Out: 10:41:54 AM Scope Withdrawal Time: 0 hours 9 minutes 13 seconds  Total Procedure Duration: 0 hours 15 minutes 57 seconds  Findings:                 The perianal and digital rectal examinations were                            normal.                           Multiple small and large-mouthed diverticula were                            found in the sigmoid colon, descending colon,  transverse colon and hepatic flexure.                           The exam was otherwise without abnormality on                            direct and retroflexion views. Complications:            No immediate complications. Estimated Blood Loss:     Estimated blood loss: none. Impression:               - Diverticulosis in the sigmoid colon, in the                            descending colon, in the transverse colon and at                            the hepatic flexure.                           - The examination was otherwise normal on direct                            and retroflexion views.                           - No specimens collected. Recommendation:           - Discharge patient to home.                           - Resume previous diet today. High-fiber diet                            recommended                            - Continue present medications.                           - Repeat colonoscopy in 10 years for screening                            purposes earlier with the development of any new                            symptoms. Tressia Danas MD, MD 04/21/2018 10:48:34 AM This report has been signed electronically.

## 2018-04-21 NOTE — Patient Instructions (Signed)
Handout Provided:  Diverticulosis  .YOU HAD AN ENDOSCOPIC PROCEDURE TODAY AT THE [facility]:   Refer to the procedure report that was given to you for any specific questions about what was found during the examination.  If the procedure report does not answer your questions, please call your gastroenterologist to clarify.  If you requested that your care partner not be given the details of your procedure findings, then the procedure report has been included in a sealed envelope for you to review at your convenience later.  YOU SHOULD EXPECT: Some feelings of bloating in the abdomen. Passage of more gas than usual.  Walking can help get rid of the air that was put into your GI tract during the procedure and reduce the bloating. If you had a lower endoscopy (such as a colonoscopy or flexible sigmoidoscopy) you may notice spotting of blood in your stool or on the toilet paper. If you underwent a bowel prep for your procedure, you may not have a normal bowel movement for a few days.  Please Note:  You might notice some irritation and congestion in your nose or some drainage.  This is from the oxygen used during your procedure.  There is no need for concern and it should clear up in a day or so.  SYMPTOMS TO REPORT IMMEDIATELY:   Following lower endoscopy (colonoscopy or flexible sigmoidoscopy):  Excessive amounts of blood in the stool  Significant tenderness or worsening of abdominal pains  Swelling of the abdomen that is new, acute  Fever of 100F or higher   For urgent or emergent issues, a gastroenterologist can be reached at any hour by calling [phone].   DIET:  We do recommend a small meal at first, but then you may proceed to your regular diet.  Drink plenty of fluids but you should avoid alcoholic beverages for 24 hours.  ACTIVITY:  You should plan to take it easy for the rest of today and you should NOT DRIVE or use heavy machinery until tomorrow (because of the  sedation medicines used during the test).    FOLLOW UP: Our staff will call the number listed on your records the next business day following your procedure to check on you and address any questions or concerns that you may have regarding the information given to you following your procedure. If we do not reach you, we will leave a message.  However, if you are feeling well and you are not experiencing any problems, there is no need to return our call.  We will assume that you have returned to your regular daily activities without incident.  If any biopsies were taken you will be contacted by phone or by letter within the next 1-3 weeks.  Please call us at [phone] if you have not heard about the biopsies in 3 weeks.    SIGNATURES/CONFIDENTIALITY: You and/or your care partner have signed paperwork which will be entered into your electronic medical record.  These signatures attest to the fact that that the information above on your After Visit Summary has been reviewed and is understood.  Full responsibility of the confidentiality of this discharge information lies with you and/or your care-partner.

## 2018-04-21 NOTE — Progress Notes (Signed)
PT taken to PACU. Monitors in place. VSS. Report given to RN. 

## 2018-04-22 ENCOUNTER — Telehealth: Payer: Self-pay | Admitting: *Deleted

## 2018-04-22 NOTE — Telephone Encounter (Signed)
  Follow up Call-  Call back number 04/21/2018  Post procedure Call Back phone  # [phone]  Permission to leave phone message Yes  Some recent data might be hidden     Patient questions:  Do you have a fever, pain , or abdominal swelling? No. Pain Score  0 *  Have you tolerated food without any problems? Yes.    Have you been able to return to your normal activities? Yes.    Do you have any questions about your discharge instructions: Diet   No. Medications  No. Follow up visit  No.  Do you have questions or concerns about your Care? No.  Actions: * If pain score is 4 or above: No action needed, pain <4.

## 2018-04-25 ENCOUNTER — Other Ambulatory Visit: Payer: Self-pay

## 2018-04-28 ENCOUNTER — Ambulatory Visit: Payer: Medicaid Other | Admitting: Neurology

## 2018-04-28 ENCOUNTER — Encounter: Payer: Self-pay | Admitting: Neurology

## 2018-04-28 ENCOUNTER — Other Ambulatory Visit: Payer: Self-pay

## 2018-04-28 VITALS — BP 126/72 | HR 66 | Ht 63.0 in | Wt 163.0 lb

## 2018-04-28 DIAGNOSIS — G40009 Localization-related (focal) (partial) idiopathic epilepsy and epileptic syndromes with seizures of localized onset, not intractable, without status epilepticus: Secondary | ICD-10-CM

## 2018-04-28 MED ORDER — LEVETIRACETAM 500 MG PO TABS: 500.0000 mg | ORAL_TABLET | Freq: Two times a day (BID) | ORAL | 11 refills | Status: DC

## 2018-04-28 MED ORDER — PHENYTOIN SODIUM EXTENDED 100 MG PO CAPS: 100.0000 mg | ORAL_CAPSULE | ORAL | 11 refills | Status: AC

## 2018-04-28 NOTE — Patient Instructions (Addendum)
1. Schedule MRI brain with and without contrast  We have sent a [facility] to [facility] for your [facility] and they will call you directly to schedule your appt. They are located at [address] If you need to contact them directly please call [phone].   2. Schedule 1-hour sleep-deprived EEG 3. Start Keppra 500mg  twice a day 4. Continue Phenytoin 100mg : take 1 cap in AM, 1 cap in PM, 2 caps at bedtime 5. Follow-up in 3-4 months, call for any changes  Seizure Precautions: 1. If medication has been prescribed for you to prevent seizures, take it exactly as directed.  Do not stop taking the medicine without talking to your doctor first, even if you have not had a seizure in a long time.   2. Avoid activities in which a seizure would cause danger to yourself or to others.  Don't operate dangerous machinery, swim alone, or climb in high or dangerous places, such as on ladders, roofs, or girders.  Do not drive unless your doctor says you may.  3. If you have any warning that you may have a seizure, lay down in a safe place where you can't hurt yourself.    4.  No driving for 6 months from last seizure, as per [place].   Please refer to the following link on the Epilepsy Foundation of America's website for more information: http://www.epilepsyfoundation.org/answerplace/Social/driving/drivingu.cfm   5.  Maintain good sleep hygiene. Avoid alcohol.  6.  Contact your doctor if you have any problems that may be related to the medicine you are taking.  7.  Call 911 and bring the patient back to the ED if:        A.  The seizure lasts longer than 5 minutes.       B.  The patient doesn't awaken shortly after the seizure  C.  The patient has new problems such as difficulty seeing, speaking or moving  D.  The patient was injured during the seizure  E.  The patient has a temperature over 102 F (39C)  F.  The patient vomited and now is having trouble breathing

## 2018-04-28 NOTE — Progress Notes (Signed)
NEUROLOGY CONSULTATION NOTE  Karla [name] MRN: [id number] DOB: [birth year]  Referring provider: Dr. Vassie Moment Primary care provider: Dr. Vassie Moment  Reason for consult:  Seizure disorder  Dear Dr Lavonia Drafts:  Thank you for your kind referral of Karla [name] for consultation of the above symptoms. Although her history is well known to you, please allow me to reiterate it for the purpose of our medical record. The patient was accompanied to the clinic by her husband who also provides collateral information. Records and images were personally reviewed where available.  HISTORY OF PRESENT ILLNESS: This is a pleasant 62 year old left-handed woman presenting for evaluation of seizures. Her husband reports that she was diagnosed with staph endocarditis in 1191, complicated by cardiac arrest where they "had to restart her heart twice." She was in a nursing home for 9 months after. He started noticing seizures in 2005, he describes behavioral arrest where she zones out and becomes unresponsive for 3-5 minutes. It takes a few minutes to get back to her normal self, her speech is a little slower, no focal weakness. No tongue bite or incontinence. He denies any convulsive activity, no nocturnal seizures. Last seizure was 2.5 weeks ago. Longest seizure-free interval is a couple of months. She denies any prior warning symptoms, no olfactory/gustatory hallucinations, deja vu, rising epigastric sensation, focal numbness/tingling/weakness, myoclonic jerks. Her husband reports that stress can be a trigger. She may have fallen due to a seizure and broke her wrist 6 months ago, but it was unwitnessed. She denies any headaches, dizziness, diplopia, dysarthria/dysphagia, neck/back pain, bowel/bladder dysfunction. She ambulates with a walker at home due to balance issues. She has been taking Dilantin 175m at 8am, 1053mat 3pm, 20016mhs since around 2005. She denies any side effects. She has occasional tingling  in both legs and they feel weak sometimes. She does not drive.   Epilepsy Risk Factors:  History of cardiac arrest x 2 in 2003. Otherwise she had a normal birth and early development.  There is no history of febrile convulsions, CNS infections such as meningitis/encephalitis, significant traumatic brain injury, neurosurgical procedures, or family history of seizures.   PAST MEDICAL HISTORY: Past Medical History:  Diagnosis Date  . Cardiac arrest ([facility]  . Endocarditis   . Hypothyroidism   . Lupus ([facility] . Seizures ([facility]  Petit mal seizues a week and a half ago.   . Stroke ([facility] . Thyroid disease    hypothyroidism    PAST SURGICAL HISTORY: Past Surgical History:  Procedure Laterality Date  . APPENDECTOMY    . CESAREAN SECTION    . PATELLA FRACTURE SURGERY    . TUBAL LIGATION      MEDICATIONS: Current Outpatient Medications on File Prior to Visit  Medication Sig Dispense Refill  . aspirin EC 81 MG tablet Take 81 mg by mouth daily.    . Calcium Carb-Cholecalciferol (CALCIUM 600 + D PO) Take 1 tablet by mouth daily.    . ciprofloxacin (CIPRO) 500 MG tablet TAKE 1 TABLET BY MOUTH EVERY 12 HOURS FOR 7 DAYS  0  . levothyroxine (SYNTHROID, LEVOTHROID) 100 MCG tablet Take 50 mcg by mouth daily before breakfast.    . Multiple Vitamins-Minerals (MULTIVITAMIN WITH MINERALS) tablet Take 1 tablet by mouth daily.    . phenytoin (DILANTIN) 100 MG ER capsule Take 100-200 mg by mouth See admin instructions. Take 1 capsule at 8 am, 1 pm and then take 2 capsules at beditme    .  SUPREP BOWEL PREP KIT 17.5-3.13-1.6 GM/177ML SOLN TAKE BY MOUTH AS DIRECTED FOR 1 DOSE  0   No current facility-administered medications on file prior to visit.     ALLERGIES: No Known Allergies  FAMILY HISTORY: Family History  Problem Relation Age of Onset  . Lung cancer Father   . Thyroid disease Sister   . Thyroid disease Sister   . Healthy Son   . Colon cancer Neg Hx   . Esophageal cancer Neg Hx     . Stomach cancer Neg Hx   . Rectal cancer Neg Hx     SOCIAL HISTORY: Social History   Socioeconomic History  . Marital status: Married    Spouse name: Not on file  . Number of children: Not on file  . Years of education: Not on file  . Highest education level: Not on file  Occupational History  . Not on file  Social Needs  . Financial resource strain: Not on file  . Food insecurity:    Worry: Not on file    Inability: Not on file  . Transportation needs:    Medical: Not on file    Non-medical: Not on file  Tobacco Use  . Smoking status: Never Smoker  . Smokeless tobacco: Never Used  Substance and Sexual Activity  . Alcohol use: No  . Drug use: No  . Sexual activity: Not on file  Lifestyle  . Physical activity:    Days per week: Not on file    Minutes per session: Not on file  . Stress: Not on file  Relationships  . Social connections:    Talks on phone: Not on file    Gets together: Not on file    Attends religious service: Not on file    Active member of club or organization: Not on file    Attends meetings of clubs or organizations: Not on file    Relationship status: Not on file  . Intimate partner violence:    Fear of current or ex partner: Not on file    Emotionally abused: Not on file    Physically abused: Not on file    Forced sexual activity: Not on file  Other Topics Concern  . Not on file  Social History Narrative  . Not on file    REVIEW OF SYSTEMS: Constitutional: No fevers, chills, or sweats, no generalized fatigue, change in appetite Eyes: No visual changes, double vision, eye pain Ear, nose and throat: No hearing loss, ear pain, nasal congestion, sore throat Cardiovascular: No chest pain, palpitations Respiratory:  No shortness of breath at rest or with exertion, wheezes GastrointestinaI: No nausea, vomiting, diarrhea, abdominal pain, fecal incontinence Genitourinary:  No dysuria, urinary retention or frequency Musculoskeletal:  No neck  pain, back pain Integumentary: No rash, pruritus, skin lesions Neurological: as above Psychiatric: No depression, insomnia, anxiety Endocrine: No palpitations, fatigue, diaphoresis, mood swings, change in appetite, change in weight, increased thirst Hematologic/Lymphatic:  No anemia, purpura, petechiae. Allergic/Immunologic: no itchy/runny eyes, nasal congestion, recent allergic reactions, rashes  PHYSICAL EXAM: Vitals:   04/28/18 0905  BP: 126/72  Pulse: 66  SpO2: 98%   General: No acute distress Head:  Normocephalic/atraumatic Eyes: Fundoscopic exam shows bilateral sharp discs, no vessel changes, exudates, or hemorrhages Neck: supple, no paraspinal tenderness, full range of motion Back: No paraspinal tenderness Heart: regular rate and rhythm Lungs: Clear to auscultation bilaterally. Vascular: No carotid bruits. Skin/Extremities: No rash, no edema Neurological Exam: Mental status: alert and oriented to person,  place, and time, no dysarthria or aphasia, Fund of knowledge is appropriate.  Recent and remote memory are intact. 2/3 delayed recall. Attention and concentration are normal, unable to spell WORLD (8th grade education).  Able to name objects and repeat phrases. Cranial nerves: CN I: not tested CN II: pupils equal, round and reactive to light, visual fields intact, fundi unremarkable. CN III, IV, VI:  full range of motion, no nystagmus, no ptosis CN V: facial sensation intact CN VII: upper and lower face symmetric CN VIII: hearing intact to finger rub CN IX, X: gag intact, uvula midline CN XI: sternocleidomastoid and trapezius muscles intact CN XII: tongue midline Bulk & Tone: normal, no fasciculations. Motor: 5/5 throughout with no pronator drift. Sensation: intact to light touch, cold, pin, vibration and joint position sense.  No extinction to double simultaneous stimulation.  Romberg test negative Deep Tendon Reflexes: brisk +2 throughout, no ankle clonus, negative  Hoffman Plantar responses: downgoing bilaterally Cerebellar: no incoordination on finger to nose, heel to shin. No dysdiadochokinesia Gait: slow, small steps, cautious Tremor: none  IMPRESSION: This is a pleasant 62 year old left-handed woman with a history of staph endocarditis s/p cardiac arrest x 2, with subsequent seizures suggestive of focal seizures with impaired awareness. No history of convulsions. Last seizure 2.5 weeks ago. She is on Dilantin 453m daily. We discussed likely diagnosis, and continued seizures on current AED. MRI brain with and without contrast and a 1-hour sleep-deprived EEG will be ordered to further classify her seizures. We discussed adding on Keppra 5068mBID, side effects discussed. We may be able to wean off Dilantin in the future. She has unstable gait and brisk reflexes, MRI brain will be helpful to determine underlying structural abnormality, we may consider MRI cervical spine in the future. Dilantin can also cause gait instability, Dilantin level from PCP will be requested for review. She does not drive. Follow-up in 3-4 months, they know to call for any changes.   Thank you for allowing me to participate in the care of this patient. Please do not hesitate to call for any questions or concerns.   KaEllouise NewerM.D.  CC: Dr. LoLavonia Drafts

## 2018-04-30 ENCOUNTER — Encounter: Payer: Self-pay | Admitting: Rheumatology

## 2018-04-30 ENCOUNTER — Ambulatory Visit: Payer: Medicaid Other | Admitting: Rheumatology

## 2018-04-30 VITALS — BP 114/79 | HR 64 | Resp 13 | Ht 64.0 in | Wt 164.8 lb

## 2018-04-30 DIAGNOSIS — M3219 Other organ or system involvement in systemic lupus erythematosus: Secondary | ICD-10-CM

## 2018-04-30 DIAGNOSIS — Z79899 Other long term (current) drug therapy: Secondary | ICD-10-CM

## 2018-04-30 DIAGNOSIS — Z8679 Personal history of other diseases of the circulatory system: Secondary | ICD-10-CM

## 2018-04-30 DIAGNOSIS — I7300 Raynaud's syndrome without gangrene: Secondary | ICD-10-CM

## 2018-04-30 DIAGNOSIS — R569 Unspecified convulsions: Secondary | ICD-10-CM | POA: Diagnosis not present

## 2018-04-30 DIAGNOSIS — Z8639 Personal history of other endocrine, nutritional and metabolic disease: Secondary | ICD-10-CM

## 2018-04-30 DIAGNOSIS — Z862 Personal history of diseases of the blood and blood-forming organs and certain disorders involving the immune mechanism: Secondary | ICD-10-CM

## 2018-04-30 MED ORDER — HYDROXYCHLOROQUINE SULFATE 200 MG PO TABS: ORAL_TABLET | ORAL | 2 refills | Status: DC

## 2018-04-30 NOTE — Patient Instructions (Addendum)
Hydroxychloroquine tablets What is this medicine? HYDROXYCHLOROQUINE (hye drox ee KLOR oh kwin) is used to treat rheumatoid arthritis and systemic lupus erythematosus. It is also used to treat malaria. This medicine may be used for other purposes; ask your health care provider or pharmacist if you have questions. COMMON BRAND NAME(S): Plaquenil, Quineprox What should I tell my health care provider before I take this medicine? They need to know if you have any of these conditions: -diabetes -eye disease, vision problems -G6PD deficiency -history of blood diseases -history of irregular heartbeat -if you often drink alcohol -kidney disease -liver disease -porphyria -psoriasis -seizures -an unusual or allergic reaction to chloroquine, hydroxychloroquine, other medicines, foods, dyes, or preservatives -pregnant or trying to get pregnant -breast-feeding How should I use this medicine? Take this medicine by mouth with a glass of water. Follow the directions on the prescription label. Avoid taking antacids within 4 hours of taking this medicine. It is best to separate these medicines by at least 4 hours. Do not cut, crush or chew this medicine. You can take it with or without food. If it upsets your stomach, take it with food. Take your medicine at regular intervals. Do not take your medicine more often than directed. Take all of your medicine as directed even if you think you are better. Do not skip doses or stop your medicine early. Talk to your pediatrician regarding the use of this medicine in children. While this drug may be prescribed for selected conditions, precautions do apply. Overdosage: If you think you have taken too much of this medicine contact a poison control center or emergency room at once. NOTE: This medicine is only for you. Do not share this medicine with others. What if I miss a dose? If you miss a dose, take it as soon as you can. If it is almost time for your next dose,  take only that dose. Do not take double or extra doses. What may interact with this medicine? Do not take this medicine with any of the following medications: -cisapride -dofetilide -dronedarone -live virus vaccines -penicillamine -pimozide -thioridazine -ziprasidone This medicine may also interact with the following medications: -ampicillin -antacids -cimetidine -cyclosporine -digoxin -medicines for diabetes, like insulin, glipizide, glyburide -medicines for seizures like carbamazepine, phenobarbital, phenytoin -mefloquine -methotrexate -other medicines that prolong the QT interval (cause an abnormal heart rhythm) -praziquantel This list may not describe all possible interactions. Give your health care provider a list of all the medicines, herbs, non-prescription drugs, or dietary supplements you use. Also tell them if you smoke, drink alcohol, or use illegal drugs. Some items may interact with your medicine. What should I watch for while using this medicine? Tell your doctor or healthcare professional if your symptoms do not start to get better or if they get worse. Avoid taking antacids within 4 hours of taking this medicine. It is best to separate these medicines by at least 4 hours. Tell your doctor or health care professional right away if you have any change in your eyesight. Your vision and blood may be tested before and during use of this medicine. This medicine can make you more sensitive to the sun. Keep out of the sun. If you cannot avoid being in the sun, wear protective clothing and use sunscreen. Do not use sun lamps or tanning beds/booths. What side effects may I notice from receiving this medicine? Side effects that you should report to your doctor or health care professional as soon as possible: -allergic reactions like skin rash,   itching or hives, swelling of the face, lips, or tongue -changes in vision -decreased hearing or ringing of the ears -redness,  blistering, peeling or loosening of the skin, including inside the mouth -seizures -sensitivity to light -signs and symptoms of a dangerous change in heartbeat or heart rhythm like chest pain; dizziness; fast or irregular heartbeat; palpitations; feeling faint or lightheaded, falls; breathing problems -signs and symptoms of liver injury like dark yellow or brown urine; general ill feeling or flu-like symptoms; light-colored stools; loss of appetite; nausea; right upper belly pain; unusually weak or tired; yellowing of the eyes or skin -signs and symptoms of low blood sugar such as feeling anxious; confusion; dizziness; increased hunger; unusually weak or tired; sweating; shakiness; cold; irritable; headache; blurred vision; fast heartbeat; loss of consciousness -uncontrollable head, mouth, neck, arm, or leg movements Side effects that usually do not require medical attention (report to your doctor or health care professional if they continue or are bothersome): -anxious -diarrhea -dizziness -hair loss -headache -irritable -loss of appetite -nausea, vomiting -stomach pain This list may not describe all possible side effects. Call your doctor for medical advice about side effects. You may report side effects to FDA at 1-800-FDA-1088. Where should I keep my medicine? Keep out of the reach of children. In children, this medicine can cause overdose with small doses. Store at room temperature between 15 and 30 degrees C (59 and 86 degrees F). Protect from moisture and light. Throw away any unused medicine after the expiration date. NOTE: This sheet is a summary. It may not cover all possible information. If you have questions about this medicine, talk to your doctor, pharmacist, or health care provider.  2018 Elsevier/Gold Standard (2016-01-11 14:16:15)  Standing Labs We placed an order today for your standing lab work.    Please come back and get your standing labs in December and then every 3  months.   Please get ds DNA with your next lab.  We have open lab Monday through Friday from 8:30-11:30 AM and 1:30-4:00 PM  at the office of Dr. Pollyann SavoyShaili Laynie Espy.   You may experience shorter wait times on Monday and Friday afternoons. The office is located at [address], [address], [place], [place] [address] No appointment is necessary.   Labs are drawn by [facility].  You may receive a bill from [facility] for your lab work. If you have any questions regarding directions or hours of operation,  please call [phone].   Just as a reminder please drink plenty of water prior to coming for your lab work. Thanks!  Vaccines You are taking a medication(s) that can suppress your immune system.  The following immunizations are recommended: . Flu annually . Pneumonia (Pneumovax 23 and Prevnar 13 after age 62) . Shingrix  Please check with your PCP to make sure you are up to date.

## 2018-04-30 NOTE — Progress Notes (Signed)
Pharmacy Note  Patient seen by Dr. Corliss Skainseveshwar in office today and plan to start Plaquenil.  Patient was counseled on the purpose, proper use, and adverse effects of hydroxychloroquine including nausea/diarrhea, skin rash, headaches, and sun sensitivity.  Discussed importance of annual eye exams while on hydroxychloroquine to monitor to ocular toxicity and discussed importance of frequent laboratory monitoring. Instructed patient to return in 1 month and then every 5 months for lab work. Reviewed recommended vaccines. Provided patient with eye exam form for baseline ophthalmologic exam.  Provided patient with educational materials on hydroxychloroquine and answered all questions.  Patient consented to hydroxychloroquine.  Will upload consent in the media tab.    Karla FesterAmber Avynn [name], PharmD, [facility]ACP Rheumatology Clinical Pharmacist  04/30/2018 3:25 PM

## 2018-05-06 ENCOUNTER — Ambulatory Visit (INDEPENDENT_AMBULATORY_CARE_PROVIDER_SITE_OTHER): Payer: Medicaid Other | Admitting: Neurology

## 2018-05-06 DIAGNOSIS — G40009 Localization-related (focal) (partial) idiopathic epilepsy and epileptic syndromes with seizures of localized onset, not intractable, without status epilepticus: Secondary | ICD-10-CM

## 2018-05-14 ENCOUNTER — Ambulatory Visit
Admission: RE | Admit: 2018-05-14 | Discharge: 2018-05-14 | Disposition: A | Discharge status: Home / Self Care | Payer: Medicaid Other | Source: Ambulatory Visit | Attending: Neurology | Admitting: Neurology

## 2018-05-14 DIAGNOSIS — G40009 Localization-related (focal) (partial) idiopathic epilepsy and epileptic syndromes with seizures of localized onset, not intractable, without status epilepticus: Secondary | ICD-10-CM

## 2018-05-14 MED ORDER — GADOBENATE DIMEGLUMINE 529 MG/ML IV SOLN
15.0000 mL | Freq: Once | INTRAVENOUS | Status: AC | PRN
  Administered 2018-05-14: 15 mL via INTRAVENOUS

## 2018-05-14 NOTE — Procedures (Signed)
ELECTROENCEPHALOGRAM REPORT  Date of Study: 05/06/2018  Patient's Name: Karla [name] MRN: [id number] Date of Birth: Jul 02, 1955  Referring Provider: Dr. Patrcia DollyKaren Aquino  Clinical History: This is a 62 year old woman with a history of cardiac arrest x 2, with subsequent seizures, EEG for classification.  Medications: DILANTIN 100 MG ER capsule  aspirin EC 81 MG tablet  CALCIUM 600 + D PO   CIPRO 500 MG tablet  SYNTHROID, LEVOTHROID 100 MCG tablet   MULTIVITAMIN WITH MINERALS tablet    Technical Summary: A multichannel digital 1-hour sleep-deprived EEG recording measured by the international 10-20 system with electrodes applied with paste and impedances below 5000 ohms performed in our laboratory with EKG monitoring in an awake and asleep patient.  Hyperventilation and photic stimulation were performed.  The digital EEG was referentially recorded, reformatted, and digitally filtered in a variety of bipolar and referential montages for optimal display.    Description: The patient is awake and asleep during the recording.  During maximal wakefulness, there is a symmetric, medium voltage 8 Hz posterior dominant rhythm that attenuates with eye opening.  There is frequent focal 4-5 Hz theta slowing over the right temporal region, and occasional independent focal theta slowing over the left temporal region. During drowsiness and sleep, there is an increase in theta slowing of the background. Deeper stages of sleep were not seen. Hyperventilation and photic stimulation did not elicit any epileptiform abnormalities, increased slowing in the bilateral temporal regions was seen.  There were no epileptiform discharges or electrographic seizures seen.    EKG lead was unremarkable.  Impression: This 1-hour awake and asleep EEG is abnormal due to focal slowing over the bilateral temporal regions, right greater than left.  Clinical Correlation of the above findings indicates focal cerebral  dysfunction over the bilateral temporal regions, right greater than left, suggestive of underlying structural or physiologic abnormality. The absence of epileptiform discharges does not exclude a clinical diagnosis of epilepsy.  If further clinical questions remain, prolonged EEG may be helpful.  Clinical correlation is advised.   Patrcia DollyKaren Aquino, M.D.

## 2018-05-29 ENCOUNTER — Telehealth: Payer: Self-pay

## 2018-05-29 NOTE — Telephone Encounter (Signed)
Spoke with pt's husband, Genevie CheshireBilly, relaying message below.  Genevie CheshireBilly states that pt is doing well on Keppra - did not do into any further detail.

## 2018-05-29 NOTE — Telephone Encounter (Signed)
-----   Message from Van ClinesKaren M Aquino, MD sent at 05/15/2018 12:57 AM EST ----- Pls let her husband know that the MRI brain did not show any evidence of tumor, bleed, or new stroke. It showed some shrinkage and mild scarring presumably from cardiac arrest. Her EEG did not show any seizure discharges but it is not like a pregnancy test that is positive or negative. How is she feeling on the Keppra? Thanks

## 2018-07-17 NOTE — Progress Notes (Deleted)
Office Visit Note  Patient: Karla Ivanatricia Vanzandt             Date of Birth: 08/16/1955           MRN: [id number]             PCP: System, Pcp Not In Referring: No ref. provider found Visit Date: 07/31/2018 Occupation: @GUAROCC @  Subjective:  No chief complaint on file.   History of Present Illness: Karla [name] is a 63 y.o. female ***   Activities of Daily Living:  Patient reports morning stiffness for *** {minute/hour:19697}.   Patient {ACTIONS;DENIES/REPORTS:21021675::"Denies"} nocturnal pain.  Difficulty dressing/grooming: {ACTIONS;DENIES/REPORTS:21021675::"Denies"} Difficulty climbing stairs: {ACTIONS;DENIES/REPORTS:21021675::"Denies"} Difficulty getting out of chair: {ACTIONS;DENIES/REPORTS:21021675::"Denies"} Difficulty using hands for taps, buttons, cutlery, and/or writing: {ACTIONS;DENIES/REPORTS:21021675::"Denies"}  No Rheumatology ROS completed.   PMFS History:  Patient Active Problem List   Diagnosis Date Noted  . Raynaud's disease without gangrene 04/30/2018  . History of hypothyroidism 04/18/2018  . History of endocarditis 04/18/2018  . History of anemia 04/18/2018  . Encounter for imaging study to confirm nasogastric (NG) tube placement   . CKD (chronic kidney disease), stage III (HCC) 11/25/2016  . SBO (small bowel obstruction) (HCC) 11/25/2016  . Seizures (HCC)   . Hypothyroidism     Past Medical History:  Diagnosis Date  . Cardiac arrest (HCC) 2004  . Endocarditis   . Hypothyroidism   . Lupus (HCC)   . Seizures (HCC)    Petit mal seizues a week and a half ago.   . Stroke (HCC)   . Thyroid disease    hypothyroidism    Family History  Problem Relation Age of Onset  . Lung cancer Father   . Thyroid disease Sister   . Thyroid disease Sister   . Healthy Son   . Colon cancer Neg Hx   . Esophageal cancer Neg Hx   . Stomach cancer Neg Hx   . Rectal cancer Neg Hx    Past Surgical History:  Procedure Laterality Date  . APPENDECTOMY    .  CESAREAN SECTION    . PATELLA FRACTURE SURGERY    . 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   Objective: Vital Signs: There were no vitals taken for this visit.   Physical Exam   Musculoskeletal Exam: ***  CDAI Exam: CDAI Score: Not documented Patient Global Assessment: Not documented; Provider Global Assessment: Not documented Swollen: Not documented; Tender: Not documented Joint Exam   Not documented   There is currently no information documented on the homunculus. Go to the Rheumatology activity and complete the homunculus joint exam.  Investigation: No additional findings.  Imaging: No results found.  Recent Labs: Lab Results  Component Value Date   WBC 4.3 11/27/2016   HGB 10.0 (L) 11/27/2016   PLT 126 (L) 11/27/2016   NA 142 11/27/2016   K 3.9 11/27/2016   CL 112 (H) 11/27/2016   CO2 23 11/27/2016   GLUCOSE 84 11/27/2016   BUN 11 11/27/2016   CREATININE 0.96 11/27/2016   BILITOT 0.5 11/27/2016   ALKPHOS 67 11/27/2016   AST 50 (H) 11/27/2016   ALT 50 11/27/2016   PROT 5.1 (L) 11/27/2016  ALBUMIN 2.7 (L) 11/27/2016   CALCIUM 8.1 (L) 11/27/2016   GFRAA >60 11/27/2016    Speciality Comments: PLQ Eye Exam: 04/30/18 WNL @ [facility] follow up in 6 months.  Procedures:  No procedures performed Allergies: Patient has no known allergies.   Assessment / Plan:     Visit Diagnoses: No diagnosis found.   Orders: No orders of the defined types were placed in this encounter.  No orders of the defined types were placed in this encounter.   Face-to-face time spent with patient was *** minutes. Greater than 50% of time was spent in counseling and coordination of care.  Follow-Up  Instructions: No follow-ups on file.   Ellen Henri, CMA  Note - This record has been created using Animal nutritionist.  Chart creation errors have been sought, but may not always  have been located. Such creation errors do not reflect on  the standard of medical care.

## 2018-07-31 ENCOUNTER — Ambulatory Visit: Payer: Medicaid Other | Admitting: Rheumatology

## 2018-08-01 ENCOUNTER — Ambulatory Visit: Payer: Medicaid Other | Admitting: Neurology

## 2018-08-07 ENCOUNTER — Other Ambulatory Visit: Payer: Self-pay | Admitting: Rheumatology

## 2018-08-07 DIAGNOSIS — M3219 Other organ or system involvement in systemic lupus erythematosus: Secondary | ICD-10-CM

## 2018-08-07 NOTE — Telephone Encounter (Signed)
Last Visit: 07/31/18 Next Visit 08/11/18 Labs: 02/19/18 WNL PLQ Eye Exam: 04/30/18 WNL   Okay to refill 30 day supply per Dr. Corliss Skains

## 2018-08-08 NOTE — Progress Notes (Signed)
Office Visit Note  Patient: Karla [name]             Date of Birth: 1956-05-08           MRN: [id number]             PCP: System, Pcp Not In Referring: No ref. provider found Visit Date: 08/11/2018 Occupation: @GUAROCC @  Subjective:  Medication monitoring   History of Present Illness: Karla [name] is a 63 y.o. female with history of systemic lupus erythematosus.  She is taking Plaquenil 200 mg 1 tablet twice daily Monday through Friday and aspirin 81 mg by mouth daily.  Patient's husband attended the visit with her and provided some of the history.  She denies any recent lupus flares.  She denies any recent rashes.  She has any sores in her mouth or nose.  She denies any hair loss or photosensitivity.  She denies any fatigue.  She denies any symptoms of Raynaud's.  She denies any palpitations or shortness of breath recently.  She denies any joint pain or joint swelling at this time.  She denies any morning stiffness.  She feels as though Plaquenil has been effective.  Her husband states in the past she was evaluated by a cardiologist and had a full work-up which was normal. She denies any concerns at this time.  She does not need any refills at this time     Activities of Daily Living:  Patient reports morning stiffness for 0 minutes.   Patient Denies nocturnal pain.  Difficulty dressing/grooming: Denies Difficulty climbing stairs: Denies Difficulty getting out of chair: Denies Difficulty using hands for taps, buttons, cutlery, and/or writing: Denies  Review of Systems  Constitutional: Negative for fatigue.  HENT: Negative for mouth sores, mouth dryness and nose dryness.   Eyes: Negative for itching and dryness.  Respiratory: Negative for shortness of breath, wheezing and difficulty breathing.   Cardiovascular: Negative for chest pain, palpitations and swelling in legs/feet.  Gastrointestinal: Negative for abdominal pain, constipation and diarrhea.  Endocrine: Negative  for increased urination.  Genitourinary: Negative for painful urination.  Musculoskeletal: Negative for arthralgias, joint pain, joint swelling and morning stiffness.  Skin: Negative for rash and redness.  Allergic/Immunologic: Negative for susceptible to infections.  Neurological: Negative for dizziness, light-headedness, headaches and memory loss.  Hematological: Negative for swollen glands.  Psychiatric/Behavioral: Negative for confusion and sleep disturbance. The patient is not nervous/anxious.     PMFS History:  Patient Active Problem List   Diagnosis Date Noted  . Raynaud's disease without gangrene 04/30/2018  . History of hypothyroidism 04/18/2018  . History of endocarditis 04/18/2018  . History of anemia 04/18/2018  . Encounter for imaging study to confirm nasogastric (NG) tube placement   . CKD (chronic kidney disease), stage III (HCC) 11/25/2016  . SBO (small bowel obstruction) (HCC) 11/25/2016  . Seizures (HCC)   . Hypothyroidism     Past Medical History:  Diagnosis Date  . Cardiac arrest (HCC) 2004  . Endocarditis   . Hypothyroidism   . Lupus (HCC)   . Seizures (HCC)    Petit mal seizues a week and a half ago.   . Stroke (HCC)   . Thyroid disease    hypothyroidism    Family History  Problem Relation Age of Onset  . Lung cancer Father   . Thyroid disease Sister   . Thyroid disease Sister   . Healthy Son   . Colon cancer Neg Hx   . Esophageal cancer Neg Hx   .  Stomach cancer Neg Hx   . Rectal cancer Neg Hx    Past Surgical History:  Procedure Laterality Date  . APPENDECTOMY    . CESAREAN SECTION    . PATELLA FRACTURE SURGERY    . 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   Objective: Vital  Signs: BP 124/87 (BP Location: Left Arm, Patient Position: Sitting, Cuff Size: Normal)   Pulse (!) 58   Resp 13   Ht 5\' 4"  (1.626 m)   Wt 158 lb 12.8 oz (72 kg)   BMI 27.26 kg/m    Physical Exam Vitals signs and nursing note reviewed.  Constitutional:      Appearance: She is well-developed.  HENT:     Head: Normocephalic and atraumatic.  Eyes:     Conjunctiva/sclera: Conjunctivae normal.  Neck:     Musculoskeletal: Normal range of motion.  Cardiovascular:     Rate and Rhythm: Normal rate and regular rhythm.     Heart sounds: Normal heart sounds.  Pulmonary:     Effort: Pulmonary effort is normal.     Breath sounds: Normal breath sounds.  Abdominal:     General: Bowel sounds are normal.     Palpations: Abdomen is soft.  Lymphadenopathy:     Cervical: No cervical adenopathy.  Skin:    General: Skin is warm and dry.     Capillary Refill: Capillary refill takes less than 2 seconds.  Neurological:     Mental Status: She is alert and oriented to person, place, and time.  Psychiatric:        Behavior: Behavior normal.      Musculoskeletal Exam: C-spine, thoracic spine, spine good range of motion.  No midline spinal tenderness.  No SI joint tenderness.  Shoulder joints, elbow joints, wrist joints, MCPs, PIPs, DIPs good range of motion no synovitis.  She has synovial thickening of bilateral wrist joints.  She has complete fist formation bilaterally.  Hip joint slightly limited range of motion.  Knee joints full range of motion with no warmth or effusion.  No tenderness or swelling of ankle joints.  No tenderness of MTP joints.  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  Go to the Rheumatology activity and complete the homunculus joint exam.  Investigation: No additional findings.  Imaging: No results found.  Recent Labs: Lab Results  Component Value Date   WBC 4.3 11/27/2016   HGB 10.0 (L) 11/27/2016   PLT 126 (L) 11/27/2016   NA 142 11/27/2016   K 3.9 11/27/2016   CL 112 (H) 11/27/2016   CO2 23 11/27/2016   GLUCOSE 84 11/27/2016   BUN 11 11/27/2016   CREATININE 0.96 11/27/2016   BILITOT 0.5 11/27/2016   ALKPHOS 67 11/27/2016   AST 50 (H) 11/27/2016   ALT 50 11/27/2016   PROT 5.1 (L) 11/27/2016   ALBUMIN 2.7 (L) 11/27/2016   CALCIUM 8.1 (L) 11/27/2016  GFRAA >60 11/27/2016    Speciality Comments: PLQ Eye Exam: 04/30/18 WNL @ [facility] follow up in 6 months.  Procedures:  No procedures performed Allergies: Patient has no known allergies.   Assessment / Plan:     Visit Diagnoses: Other systemic lupus erythematosus with other organ involvement (HCC) -  Positive ANA, positive RNP, low C4, beta-2 GP 1+, anticardiolipin positive, lupus anticoagulant positive: She has not had any signs or symptoms of a lupus flare recently.  She is clinically doing well on Plaquenil 200 mg 1 tablet twice daily Monday through Friday.  She is also taking aspirin 81 mg by mouth daily.  She has been tolerating Plaquenil without any side effects.  She has no joint pain or joint swelling at this time.  No synovitis was noted.  No oral or nasal ulcerations on exam.  She is not having any sicca symptoms and no parotid swelling or tenderness was noted.  No Malar rash noted.  She has not had any photosensitivity or hair loss.  She has not experienced any symptoms of Raynaud's and no digital ulcerations or signs of gangrene were noted.  She denied any shortness of breath or palpitations.  According to the patient's husband she has been evaluated by a cardiologist in the past and the work-up was negative.  She will continue on Plaquenil 200 mg 1 tablet twice daily  Monday through Friday and aspirin 81 mg by mouth daily.  She does not need any refills at this time.  We will check autoimmune lab work today.  She was advised to notify us if she develops any signs or symptoms of a flare.  She will follow-up in the office in 5 months.- Plan: COMPLETE METABOLIC PANEL WITH GFR, CBC with Differential/Platelet, Urinalysis, Routine w reflex microscopic, Anti-DNA antibody, double-stranded, C3 and C4, Sedimentation rate, VITAMIN D 25 Hydroxy (Vit-D Deficiency, Fractures), Cardiolipin antibodies, IgG, IgM, IgA, Beta-2 glycoprotein antibodies, Lupus Anticoagulant Eval w/Reflex, RNP Antibody  High risk medication use - PLQ 200 mg BID M-F, Aspirin  81 mg 1 tablet po daily.  CBC and CMP will be drawn today to monitor for drug toxicity.- Plan: COMPLETE METABOLIC PANEL WITH GFR, CBC with Differential/Platelet  Raynaud's disease without gangrene: She has not any symptoms of Raynaud's recently.  No digital stations or signs of gangrene were noted.  She was advised to notify us if she develops worsening symptoms of Raynaud's.  Other medical conditions are listed as follows:  History of hypothyroidism  Seizures (HCC) - Dr. Patrcia Dolly  History of endocarditis  History of anemia  History of small bowel obstruction   Orders: Orders Placed This Encounter  Procedures  . COMPLETE METABOLIC PANEL WITH GFR  . CBC with Differential/Platelet  . Urinalysis, Routine w reflex microscopic  . Anti-DNA antibody, double-stranded  . C3 and C4  . Sedimentation rate  . VITAMIN D 25 Hydroxy (Vit-D Deficiency, Fractures)  . Cardiolipin antibodies, IgG, IgM, IgA  . Beta-2 glycoprotein antibodies  . Lupus Anticoagulant Eval w/Reflex  . RNP Antibody   No orders of the defined types were placed in this encounter.   Follow-Up Instructions: Return in about 5 months (around 01/11/2019) for Systemic lupus erythematosus.   Gearldine Bienenstock, PA-C  Note - This record has been created using  Dragon software.  Chart creation errors have been sought, but may not always  have been located. Such creation errors do not reflect on  the standard of medical care.

## 2018-08-11 ENCOUNTER — Ambulatory Visit: Payer: Medicaid Other | Admitting: Physician Assistant

## 2018-08-11 ENCOUNTER — Encounter: Payer: Self-pay | Admitting: Physician Assistant

## 2018-08-11 VITALS — BP 124/87 | HR 58 | Resp 13 | Ht 64.0 in | Wt 158.8 lb

## 2018-08-11 DIAGNOSIS — R569 Unspecified convulsions: Secondary | ICD-10-CM

## 2018-08-11 DIAGNOSIS — Z862 Personal history of diseases of the blood and blood-forming organs and certain disorders involving the immune mechanism: Secondary | ICD-10-CM

## 2018-08-11 DIAGNOSIS — Z8639 Personal history of other endocrine, nutritional and metabolic disease: Secondary | ICD-10-CM

## 2018-08-11 DIAGNOSIS — Z8719 Personal history of other diseases of the digestive system: Secondary | ICD-10-CM

## 2018-08-11 DIAGNOSIS — M3219 Other organ or system involvement in systemic lupus erythematosus: Secondary | ICD-10-CM

## 2018-08-11 DIAGNOSIS — Z8679 Personal history of other diseases of the circulatory system: Secondary | ICD-10-CM

## 2018-08-11 DIAGNOSIS — I7300 Raynaud's syndrome without gangrene: Secondary | ICD-10-CM

## 2018-08-11 DIAGNOSIS — Z79899 Other long term (current) drug therapy: Secondary | ICD-10-CM

## 2018-08-13 LAB — URINALYSIS, ROUTINE W REFLEX MICROSCOPIC
Bacteria, UA: NONE SEEN /HPF
Bilirubin Urine: NEGATIVE
Glucose, UA: NEGATIVE
HYALINE CAST: NONE SEEN /LPF
Hgb urine dipstick: NEGATIVE
Ketones, ur: NEGATIVE
Nitrite: NEGATIVE
Protein, ur: NEGATIVE
RBC / HPF: NONE SEEN /HPF (ref 0–2)
SQUAMOUS EPITHELIAL / LPF: NONE SEEN /HPF (ref <=5)
Specific Gravity, Urine: 1.01 (ref 1.001–1.03)
pH: 5.5 (ref 5.0–8.0)

## 2018-08-13 LAB — RFLX DRVVT CONFRIM: DRVVT CONFIRM: POSITIVE — AB

## 2018-08-13 LAB — RFLX HEXAGONAL PHASE CONFIRM: Hexagonal Phase Conf: POSITIVE — AB

## 2018-08-13 LAB — LUPUS ANTICOAGULANT EVAL W/ REFLEX
PT, Mixing Interp: POSITIVE — AB
PTT-LA Screen: 60 s — ABNORMAL HIGH (ref <=40)
dRVVT: 114 s — ABNORMAL HIGH (ref <=45)

## 2018-08-13 LAB — CARDIOLIPIN ANTIBODIES, IGG, IGM, IGA
Anticardiolipin IgA: 48 [APL'U] — ABNORMAL HIGH
Anticardiolipin IgG: 67 [GPL'U] — ABNORMAL HIGH
Anticardiolipin IgM: 38 [MPL'U] — ABNORMAL HIGH

## 2018-08-13 LAB — RFX DRVVT 1:1 MIX

## 2018-08-13 LAB — THROMBIN CLOTTING TIME: Thrombin Clotting Time: 17 s (ref 13–19)

## 2018-08-13 NOTE — Progress Notes (Signed)
Please review

## 2018-08-15 NOTE — Progress Notes (Signed)
Her complement is lower and sed rate is up.  This can sometimes indicate a flare but she was asymptomatic.  He is advised patient to contact us in case she develops any increased symptoms.

## 2018-08-15 NOTE — Progress Notes (Signed)
Please advise 

## 2018-08-18 LAB — CBC WITH DIFFERENTIAL/PLATELET
Absolute Monocytes: 254 cells/uL (ref 200–950)
BASOS ABS: 20 {cells}/uL (ref 0–200)
BASOS PCT: 0.5 %
EOS PCT: 1 %
Eosinophils Absolute: 39 cells/uL (ref 15–500)
HCT: 32.6 % — ABNORMAL LOW (ref 35.0–45.0)
Hemoglobin: 11.2 g/dL — ABNORMAL LOW (ref 11.7–15.5)
Lymphs Abs: 1346 cells/uL (ref 850–3900)
MCH: 31.5 pg (ref 27.0–33.0)
MCHC: 34.4 g/dL (ref 32.0–36.0)
MCV: 91.8 fL (ref 80.0–100.0)
MPV: 12.1 fL (ref 7.5–12.5)
Monocytes Relative: 6.5 %
NEUTROS ABS: 2243 {cells}/uL (ref 1500–7800)
Neutrophils Relative %: 57.5 %
PLATELETS: 176 10*3/uL (ref 140–400)
RBC: 3.55 10*6/uL — ABNORMAL LOW (ref 3.80–5.10)
RDW: 11.4 % (ref 11.0–15.0)
Total Lymphocyte: 34.5 %
WBC: 3.9 10*3/uL (ref 3.8–10.8)

## 2018-08-18 LAB — COMPLETE METABOLIC PANEL WITH GFR
AG RATIO: 1.5 (calc) (ref 1.0–2.5)
ALT: 11 U/L (ref 6–29)
AST: 15 U/L (ref 10–35)
Albumin: 4 g/dL (ref 3.6–5.1)
Alkaline phosphatase (APISO): 113 U/L (ref 37–153)
BUN: 13 mg/dL (ref 7–25)
CALCIUM: 9.1 mg/dL (ref 8.6–10.4)
CHLORIDE: 104 mmol/L (ref 98–110)
CO2: 28 mmol/L (ref 20–32)
Creat: 0.99 mg/dL (ref 0.50–0.99)
GFR, EST NON AFRICAN AMERICAN: 61 mL/min/{1.73_m2} (ref >=60)
GFR, Est African American: 71 mL/min/{1.73_m2} (ref >=60)
Globulin: 2.7 g/dL (calc) (ref 1.9–3.7)
Glucose, Bld: 70 mg/dL (ref 65–99)
Potassium: 4.3 mmol/L (ref 3.5–5.3)
Sodium: 139 mmol/L (ref 135–146)
Total Bilirubin: 0.3 mg/dL (ref 0.2–1.2)
Total Protein: 6.7 g/dL (ref 6.1–8.1)

## 2018-08-18 LAB — BETA-2 GLYCOPROTEIN ANTIBODIES
BETA 2 GLYCO 1 IGM: 124 SMU — AB (ref <=20)
Beta-2 Glyco 1 IgA: >150 SAU — ABNORMAL HIGH (ref <=20)
Beta-2 Glyco I IgG: 98 SGU — ABNORMAL HIGH (ref <=20)

## 2018-08-18 LAB — C3 AND C4
C3 Complement: 94 mg/dL (ref 83–193)
C4 Complement: 11 mg/dL — ABNORMAL LOW (ref 15–57)

## 2018-08-18 LAB — ANTI-DNA ANTIBODY, DOUBLE-STRANDED: ds DNA Ab: 13 IU/mL — ABNORMAL HIGH

## 2018-08-18 LAB — SEDIMENTATION RATE: Sed Rate: 43 mm/h — ABNORMAL HIGH (ref 0–30)

## 2018-08-18 LAB — RNP ANTIBODY: Ribonucleic Protein(ENA) Antibody, IgG: 1.3 AI — AB

## 2018-08-18 LAB — VITAMIN D 25 HYDROXY (VIT D DEFICIENCY, FRACTURES): Vit D, 25-Hydroxy: 50 ng/mL (ref 30–100)

## 2018-08-29 ENCOUNTER — Other Ambulatory Visit: Payer: Self-pay

## 2018-08-29 DIAGNOSIS — M3219 Other organ or system involvement in systemic lupus erythematosus: Secondary | ICD-10-CM

## 2018-08-29 MED ORDER — HYDROXYCHLOROQUINE SULFATE 200 MG PO TABS: ORAL_TABLET | ORAL | 0 refills | Status: AC

## 2018-08-29 NOTE — Progress Notes (Signed)
Okay to refill PLQ per Dr. Deveshwar. Patient has been notified.  

## 2019-03-05 ENCOUNTER — Ambulatory Visit: Payer: Medicaid Other | Admitting: Neurology

## 2019-05-14 ENCOUNTER — Other Ambulatory Visit: Payer: Self-pay | Admitting: Neurology

## 2020-01-27 IMAGING — MG DIGITAL SCREENING BILATERAL MAMMOGRAM WITH TOMO AND CAD
8 series · 8 of 24 positions shown · non-contrast
Comparison: None.

CLINICAL DATA: Screening.

EXAM:
DIGITAL SCREENING BILATERAL MAMMOGRAM WITH TOMO AND CAD

[R CC synth-2D]
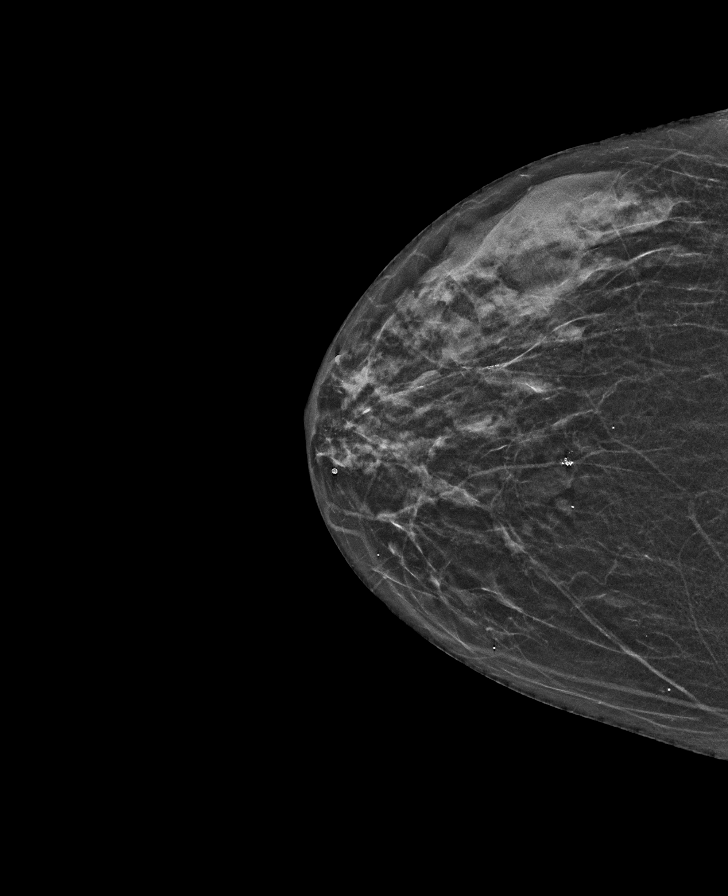

[R MLO synth-2D]
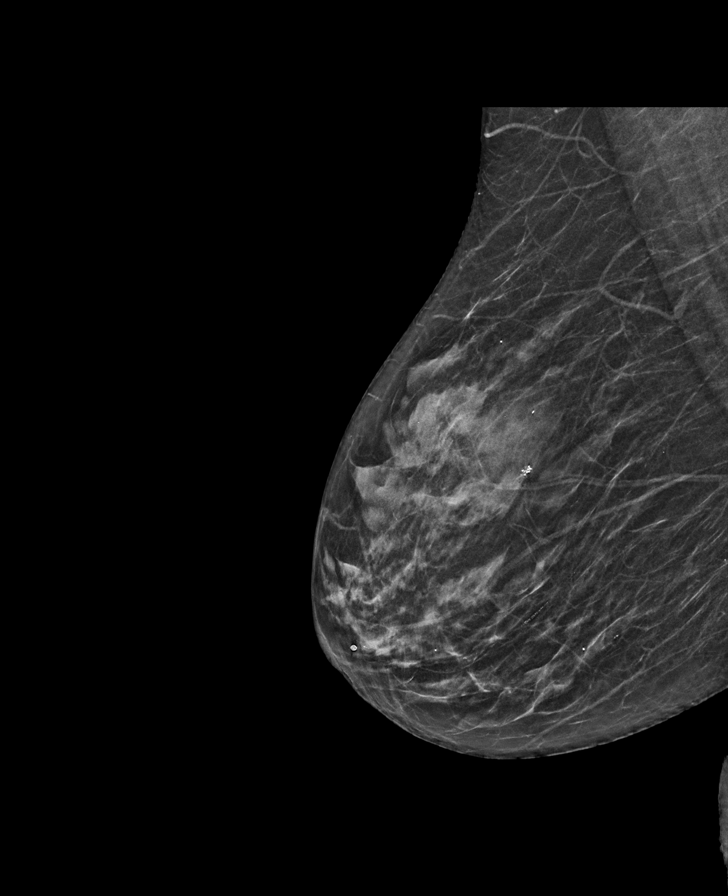

[L MLO synth-2D]
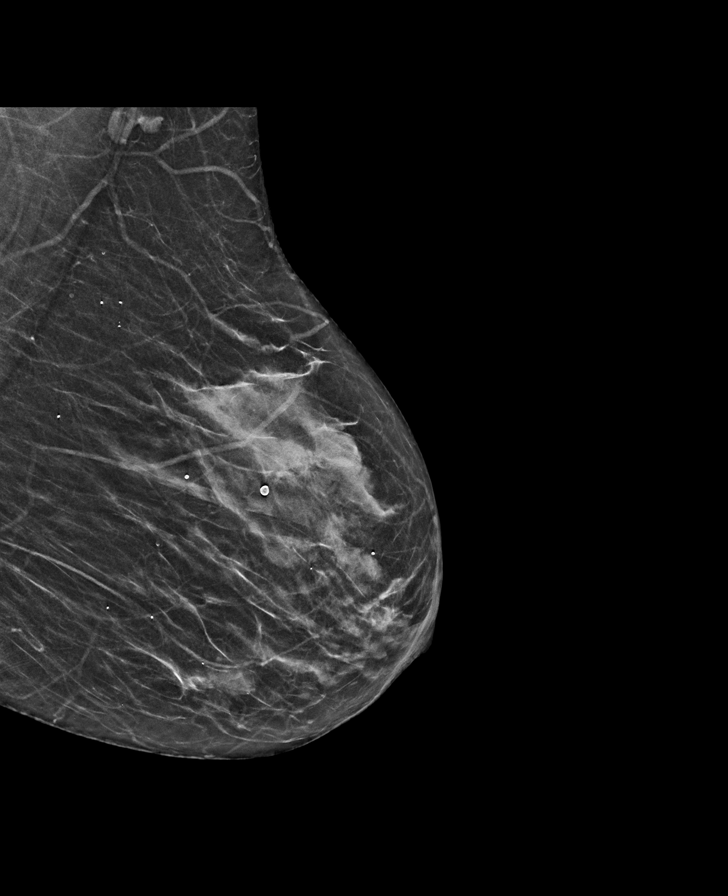

[L CC synth-2D]
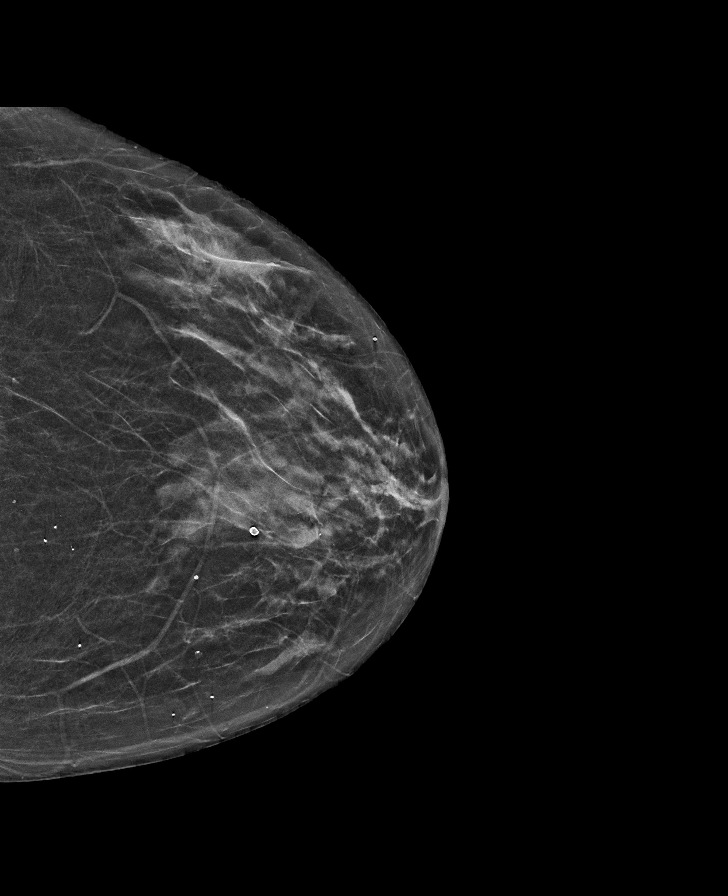

[R MLO tomo · tomo slice 24/47.0]
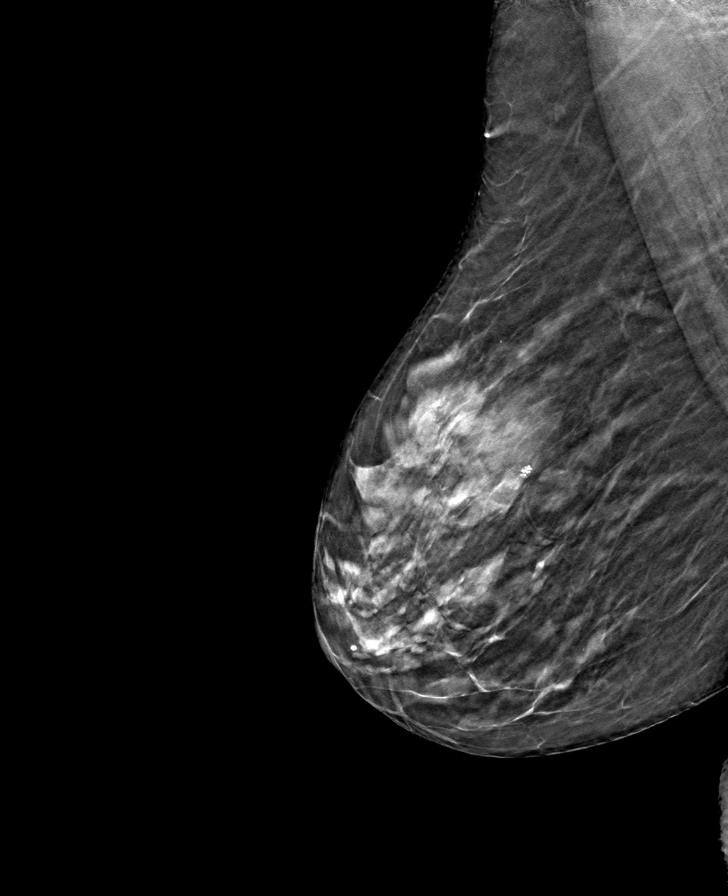

[R CC tomo · tomo slice 21/42.0]
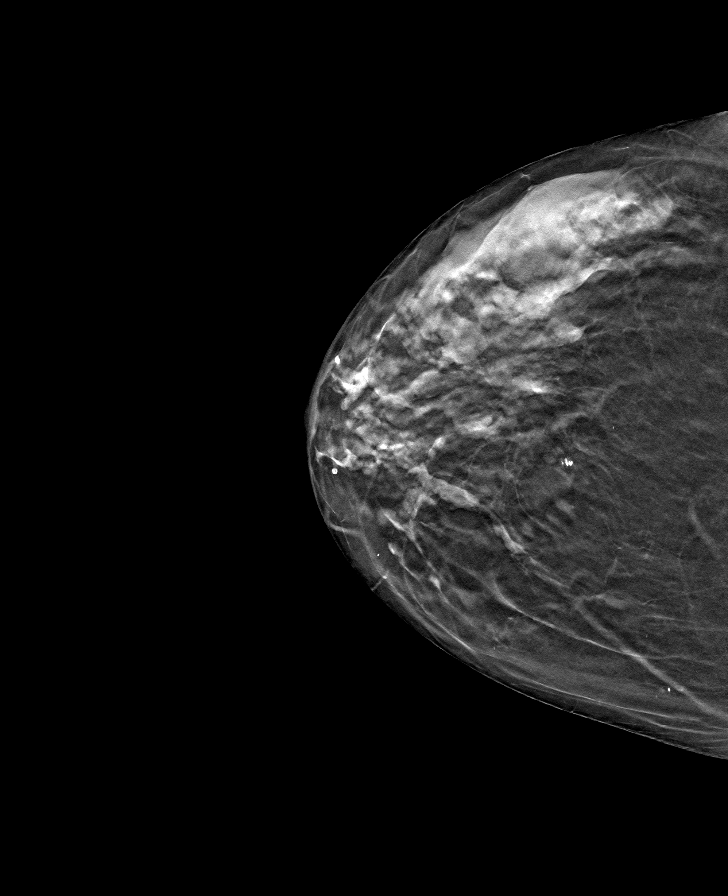

[L MLO tomo · tomo slice 25/50.0]
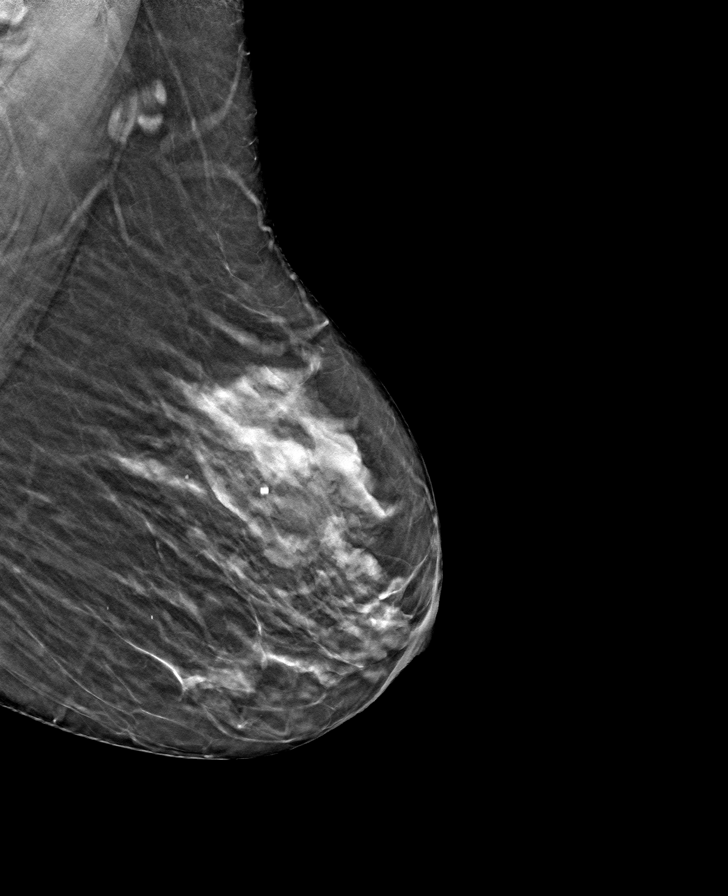

[L CC tomo · tomo slice 23/46.0]
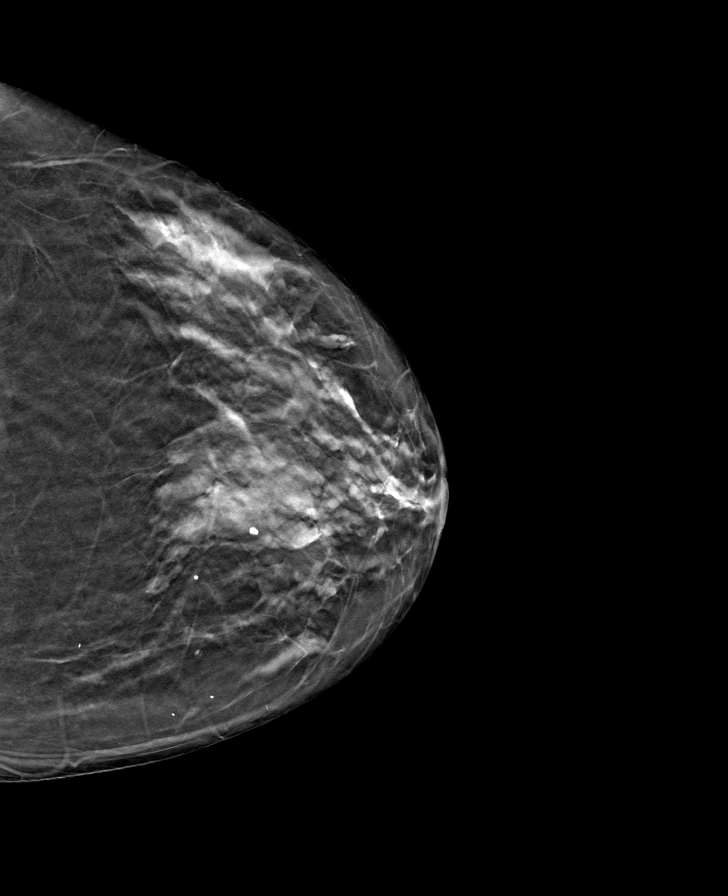

[8 of 24 positions shown; findings below may reference images not displayed]

ACR Breast Density Category c: The breast tissue is heterogeneously
dense, which may obscure small masses
FINDINGS: There are no findings suspicious for malignancy. Images were
processed with CAD.
IMPRESSION: No mammographic evidence of malignancy. A result letter of this
screening mammogram will be mailed directly to the patient.

RECOMMENDATION:
Screening mammogram in one year. (Code:EM-2-IHY)

BI-RADS CATEGORY  1: Negative.

## 2024-07-12 DEATH — deceased
